# Patient Record
Sex: Male | Born: 1975
Health system: Southern US, Community
[De-identification: ages and names within clinical notes are randomized; demographics above are authoritative.]

## PROBLEM LIST (undated history)

## (undated) DIAGNOSIS — N200 Calculus of kidney: Secondary | ICD-10-CM

## (undated) DIAGNOSIS — I10 Essential (primary) hypertension: Secondary | ICD-10-CM

## (undated) HISTORY — DX: Calculus of kidney: N20.0

## (undated) HISTORY — PX: LITHOTRIPSY: SUR834

## (undated) HISTORY — PX: ESOPHAGOGASTRODUODENOSCOPY: SHX1529

## (undated) HISTORY — PX: KNEE SURGERY: SHX244

---

## 2013-05-09 ENCOUNTER — Emergency Department (HOSPITAL_COMMUNITY)
Admission: EM | Admit: 2013-05-09 | Discharge: 2013-05-09 | Disposition: A | Discharge status: Other Acute Inpatient Hospital | Payer: 59 | Attending: Emergency Medicine | Admitting: Emergency Medicine

## 2013-05-09 ENCOUNTER — Encounter (HOSPITAL_COMMUNITY): Payer: Self-pay | Admitting: Emergency Medicine

## 2013-05-09 ENCOUNTER — Emergency Department (HOSPITAL_COMMUNITY): Payer: 59

## 2013-05-09 DIAGNOSIS — Z79899 Other long term (current) drug therapy: Secondary | ICD-10-CM | POA: Diagnosis not present

## 2013-05-09 DIAGNOSIS — T23229A Burn of second degree of unspecified single finger (nail) except thumb, initial encounter: Secondary | ICD-10-CM | POA: Insufficient documentation

## 2013-05-09 DIAGNOSIS — X020XXA Exposure to flames in controlled fire in building or structure, initial encounter: Secondary | ICD-10-CM | POA: Diagnosis not present

## 2013-05-09 DIAGNOSIS — T24239A Burn of second degree of unspecified lower leg, initial encounter: Secondary | ICD-10-CM | POA: Insufficient documentation

## 2013-05-09 DIAGNOSIS — Y9389 Activity, other specified: Secondary | ICD-10-CM | POA: Diagnosis not present

## 2013-05-09 DIAGNOSIS — T25229A Burn of second degree of unspecified foot, initial encounter: Secondary | ICD-10-CM | POA: Insufficient documentation

## 2013-05-09 DIAGNOSIS — I10 Essential (primary) hypertension: Secondary | ICD-10-CM | POA: Insufficient documentation

## 2013-05-09 DIAGNOSIS — T23009A Burn of unspecified degree of unspecified hand, unspecified site, initial encounter: Secondary | ICD-10-CM | POA: Diagnosis present

## 2013-05-09 DIAGNOSIS — Y92009 Unspecified place in unspecified non-institutional (private) residence as the place of occurrence of the external cause: Secondary | ICD-10-CM | POA: Diagnosis not present

## 2013-05-09 DIAGNOSIS — T300 Burn of unspecified body region, unspecified degree: Secondary | ICD-10-CM

## 2013-05-09 DIAGNOSIS — T23202A Burn of second degree of left hand, unspecified site, initial encounter: Secondary | ICD-10-CM

## 2013-05-09 DIAGNOSIS — T23209A Burn of second degree of unspecified hand, unspecified site, initial encounter: Secondary | ICD-10-CM | POA: Insufficient documentation

## 2013-05-09 DIAGNOSIS — T23232A Burn of second degree of multiple left fingers (nail), not including thumb, initial encounter: Secondary | ICD-10-CM

## 2013-05-09 HISTORY — DX: Essential (primary) hypertension: I10

## 2013-05-09 HISTORY — DX: Morbid (severe) obesity due to excess calories: E66.01

## 2013-05-09 LAB — CARBOXYHEMOGLOBIN
Carboxyhemoglobin: 2.1 % — ABNORMAL HIGH (ref 0.5–1.5)
Methemoglobin: 0.9 % (ref 0.0–1.5)
O2 Saturation: 57.6 %
Total hemoglobin: 18.1 g/dL — ABNORMAL HIGH (ref 13.5–18.0)

## 2013-05-09 MED ORDER — HYDROMORPHONE HCL PF 1 MG/ML IJ SOLN
1.0000 mg | Freq: Once | INTRAMUSCULAR | Status: AC
Start: 2013-05-09 — End: 2013-05-09
  Administered 2013-05-09: 1 mg via INTRAVENOUS
  Filled 2013-05-09: qty 1

## 2013-05-09 MED ORDER — HYDROMORPHONE HCL PF 1 MG/ML IJ SOLN
1.0000 mg | Freq: Once | INTRAMUSCULAR | Status: AC
  Administered 2013-05-09: 1 mg via INTRAVENOUS
  Filled 2013-05-09: qty 1

## 2013-05-09 MED ORDER — LACTATED RINGERS IV BOLUS (SEPSIS)
1000.0000 mL | Freq: Once | INTRAVENOUS | Status: AC
  Administered 2013-05-09: 1000 mL via INTRAVENOUS
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
## 2013-05-09 NOTE — ED Notes (Signed)
We saline dressing applied to feet and legs.
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
## 2013-05-09 NOTE — ED Provider Notes (Signed)
Medical screening examination/treatment/procedure(s) were conducted as a shared visit with non-physician practitioner(s) and myself.  I personally evaluated the patient during the encounter.   EKG Interpretation None        Shon Batonourtney F Guilford Shannahan, MD 05/09/13 2021

## 2013-05-19 DIAGNOSIS — L0390 Cellulitis, unspecified: Secondary | ICD-10-CM

## 2013-05-19 HISTORY — DX: Cellulitis, unspecified: L03.90

## 2015-01-23 DIAGNOSIS — R7301 Impaired fasting glucose: Secondary | ICD-10-CM | POA: Insufficient documentation

## 2015-01-23 DIAGNOSIS — K2270 Barrett's esophagus without dysplasia: Secondary | ICD-10-CM

## 2015-01-23 DIAGNOSIS — E782 Mixed hyperlipidemia: Secondary | ICD-10-CM

## 2015-01-23 DIAGNOSIS — K219 Gastro-esophageal reflux disease without esophagitis: Secondary | ICD-10-CM | POA: Insufficient documentation

## 2015-01-23 DIAGNOSIS — Q6650 Congenital pes planus, unspecified foot: Secondary | ICD-10-CM

## 2015-01-23 HISTORY — DX: Barrett's esophagus without dysplasia: K22.70

## 2015-01-23 HISTORY — DX: Impaired fasting glucose: R73.01

## 2015-01-23 HISTORY — DX: Mixed hyperlipidemia: E78.2

## 2015-01-23 HISTORY — DX: Congenital pes planus, unspecified foot: Q66.50

## 2015-01-23 HISTORY — DX: Gastro-esophageal reflux disease without esophagitis: K21.9

## 2015-02-22 DIAGNOSIS — R0683 Snoring: Secondary | ICD-10-CM

## 2015-02-22 DIAGNOSIS — G4719 Other hypersomnia: Secondary | ICD-10-CM

## 2015-02-22 HISTORY — DX: Other hypersomnia: G47.19

## 2015-02-22 HISTORY — DX: Snoring: R06.83

## 2015-05-24 DIAGNOSIS — G4733 Obstructive sleep apnea (adult) (pediatric): Secondary | ICD-10-CM

## 2015-05-24 HISTORY — DX: Obstructive sleep apnea (adult) (pediatric): G47.33

## 2016-01-06 DIAGNOSIS — J329 Chronic sinusitis, unspecified: Secondary | ICD-10-CM

## 2016-01-06 HISTORY — DX: Chronic sinusitis, unspecified: J32.9

## 2016-02-21 DIAGNOSIS — R109 Unspecified abdominal pain: Secondary | ICD-10-CM

## 2016-02-21 DIAGNOSIS — R1031 Right lower quadrant pain: Secondary | ICD-10-CM | POA: Insufficient documentation

## 2016-02-21 DIAGNOSIS — M5440 Lumbago with sciatica, unspecified side: Secondary | ICD-10-CM

## 2016-02-21 HISTORY — DX: Right lower quadrant pain: R10.31

## 2016-02-21 HISTORY — DX: Lumbago with sciatica, unspecified side: M54.40

## 2016-02-21 HISTORY — DX: Unspecified abdominal pain: R10.9

## 2016-07-19 DIAGNOSIS — R300 Dysuria: Secondary | ICD-10-CM

## 2016-07-19 HISTORY — DX: Dysuria: R30.0

## 2017-04-08 ENCOUNTER — Encounter: Payer: Self-pay | Admitting: Family Medicine

## 2017-04-08 ENCOUNTER — Ambulatory Visit: Payer: 59 | Admitting: Family Medicine

## 2017-04-08 VITALS — BP 132/80 | HR 77 | Ht 72.0 in | Wt 253.1 lb

## 2017-04-08 DIAGNOSIS — I10 Essential (primary) hypertension: Secondary | ICD-10-CM

## 2017-04-08 HISTORY — DX: Essential (primary) hypertension: I10

## 2017-04-08 MED ORDER — IRBESARTAN 75 MG PO TABS: 75.0000 mg | ORAL_TABLET | Freq: Every day | ORAL | 1 refills | Status: DC

## 2017-04-08 MED ORDER — AMLODIPINE BESYLATE 10 MG PO TABS: 10.0000 mg | ORAL_TABLET | Freq: Every day | ORAL | 1 refills | Status: DC

## 2017-04-08 NOTE — Progress Notes (Signed)
Subjective:  Patient ID: Corey PalmerBrian A Kai, male    DOB: 07-29-1975  Age: 42 y.o. MRN: [id number]  CC: Establish Care   HPI Corey PalmerBrian A Eldredge presents for establishment of care in my new practice.  Patient is well-known to me.  He has been out of the olmesartan for almost a week now.  He is making a concerted effort effort to improve his lifestyle.  He is no longer consuming sugar and limiting his carbohydrates.  Avoiding all processed meats.  His goal is to lose 40 or 50 pounds.  He continues to turn wrenches.  He does not smoke drink or use illicit drugs.  He lives with his wife and son.  History Arlys JohnBrian has a past medical history of Hypertension.   He has no past surgical history on file.   His family history is not on file.He reports that  has never smoked. he has never used smokeless tobacco. He reports that he does not drink alcohol or use drugs.  Outpatient Medications Prior to Visit  Medication Sig Dispense Refill  . amLODipine (NORVASC) 10 MG tablet Take 1 tablet by mouth daily.    Marland Kitchen. olmesartan (BENICAR) 40 MG tablet Take 40 mg by mouth daily.    Marland Kitchen. losartan (COZAAR) 100 MG tablet Take 100 mg by mouth daily.     No facility-administered medications prior to visit.     ROS Review of Systems  Constitutional: Negative.   HENT: Negative.   Eyes: Negative.   Respiratory: Negative.   Cardiovascular: Negative.   Gastrointestinal: Negative.   Endocrine: Negative for polyphagia and polyuria.  Genitourinary: Negative.   Skin: Negative.   Neurological: Negative.   Hematological: Negative.   Psychiatric/Behavioral: Negative.     Objective:  BP 132/80 (BP Location: Left Arm, Patient Position: Sitting, Cuff Size: Normal)   Pulse 77   Ht 6' (1.829 m)   Wt 253 lb 2 oz (114.8 kg)   SpO2 97%   BMI 34.33 kg/m   Physical Exam  Constitutional: He is oriented to person, place, and time. He appears well-developed and well-nourished. No distress.  HENT:  Head: Normocephalic and atraumatic.   Right Ear: External ear normal.  Left Ear: External ear normal.  Mouth/Throat: Oropharynx is clear and moist. No oropharyngeal exudate.  Eyes: Conjunctivae are normal. Pupils are equal, round, and reactive to light. Right eye exhibits no discharge. Left eye exhibits no discharge. No scleral icterus.  Neck: Neck supple. No JVD present. No tracheal deviation present. No thyromegaly present.  Cardiovascular: Normal rate, regular rhythm and normal heart sounds.  Pulmonary/Chest: Effort normal and breath sounds normal. No stridor. No respiratory distress. He has no wheezes. He has no rales. He exhibits no tenderness.  Abdominal: Bowel sounds are normal.  Lymphadenopathy:    He has no cervical adenopathy.  Neurological: He is alert and oriented to person, place, and time.  Skin: Skin is warm and dry. He is not diaphoretic.  Psychiatric: He has a normal mood and affect.      Assessment & Plan:   Arlys JohnBrian was seen today for establish care.  Diagnoses and all orders for this visit:  Essential hypertension -     irbesartan (AVAPRO) 75 MG tablet; Take 1 tablet (75 mg total) by mouth daily. -     amLODipine (NORVASC) 10 MG tablet; Take 1 tablet (10 mg total) by mouth daily.   I have discontinued Arlys JohnBrian A. Edge's losartan and olmesartan. I have also changed his amLODipine. Additionally, I am  having him start on irbesartan.  Meds ordered this encounter  Medications  . irbesartan (AVAPRO) 75 MG tablet    Sig: Take 1 tablet (75 mg total) by mouth daily.    Dispense:  100 tablet    Refill:  1  . amLODipine (NORVASC) 10 MG tablet    Sig: Take 1 tablet (10 mg total) by mouth daily.    Dispense:  100 tablet    Refill:  1   I applauded his lifestyle changes.  Encouraged him to lose weight.  We will start him back on low-dose Avapro in place of the olmesartan.  As he continues to be may be able to discontinue the avid Avapro and possibly both blood pressure medicines.  We will check fasting blood  work next visit in 6 months.  Follow-up: No Follow-up on file.  Mliss Sax, MD

## 2017-04-10 ENCOUNTER — Encounter: Payer: Self-pay | Admitting: Family Medicine

## 2017-08-13 ENCOUNTER — Ambulatory Visit: Payer: 59 | Admitting: Family Medicine

## 2017-08-13 ENCOUNTER — Encounter: Payer: Self-pay | Admitting: Family Medicine

## 2017-08-13 VITALS — BP 138/80 | HR 94 | Temp 98.4°F | Ht 72.0 in | Wt 250.0 lb

## 2017-08-13 DIAGNOSIS — T887XXA Unspecified adverse effect of drug or medicament, initial encounter: Secondary | ICD-10-CM | POA: Diagnosis not present

## 2017-08-13 DIAGNOSIS — I781 Nevus, non-neoplastic: Secondary | ICD-10-CM | POA: Diagnosis not present

## 2017-08-13 DIAGNOSIS — L570 Actinic keratosis: Secondary | ICD-10-CM | POA: Diagnosis not present

## 2017-08-13 DIAGNOSIS — I10 Essential (primary) hypertension: Secondary | ICD-10-CM

## 2017-08-13 DIAGNOSIS — X32XXXA Exposure to sunlight, initial encounter: Secondary | ICD-10-CM

## 2017-08-13 HISTORY — DX: Actinic keratosis: L57.0

## 2017-08-13 HISTORY — DX: Unspecified adverse effect of drug or medicament, initial encounter: T88.7XXA

## 2017-08-13 HISTORY — DX: Nevus, non-neoplastic: I78.1

## 2017-08-13 MED ORDER — IRBESARTAN 300 MG PO TABS: 300.0000 mg | ORAL_TABLET | Freq: Every day | ORAL | 0 refills | Status: DC
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
## 2017-08-13 NOTE — Patient Instructions (Signed)
Actinic Keratosis An actinic keratosis is a precancerous growth on the skin. This means that it could develop into skin cancer if it is not treated. About 1% of these growths (actinic keratoses) turn into skin cancer within one year if they are not treated. It is important to have all of these growths evaluated to determine the best treatment approach. What are the causes? This condition is caused by getting too much ultraviolet (UV) radiation from the sun or other UV light sources. What increases the risk? The following factors may make you more likely to develop this condition:  Having light-colored skin and blue eyes.  Having blonde or red hair.  Spending a lot of time in the sun.  Inadequate skin protection when outdoors. This may include: ? Not using sunscreen properly. ? Not covering up skin that is exposed to sunlight.  Aging. The risk of developing an actinic keratosis increases with age.  What are the signs or symptoms? Actinic keratoses look like scaly, rough spots of skin.They can be as small as a pinhead or as big as a quarter. They may itch, hurt, or feel sensitive. In most cases, the growths become red. In some cases, they may be skin-colored, light tan, dark tan, pink, or a combination of any of these colors. There may be a small piece of pink or gray skin (skin tag) growing from the actinic keratosis. In some cases, it may be easier to notice actinic keratoses by feeling them, rather than seeing them. Actinic keratoses appear most often on areas of skin that get a lot of sun exposure, including the scalp, face, ears, lips, upper back, forearms, and the backs of the hands. Sometimes, actinic keratoses disappear, but many reappear a few days to a few weeks later. How is this diagnosed? This condition is usually diagnosed with a physical exam. A tissue sample may be removed from the actinic keratosis and examined under a microscope (biopsy). How is this treated?  Treatment for  this condition may include:  Scraping off the actinic keratosis (curettage).  Freezing the actinic keratosis with liquid nitrogen (cryosurgery). This causes the growth to eventually fall off the skin.  Applying medicated creams or gels to destroy the cells in the growth.  Applying chemicals to the actinic keratosis to make the outer layers of skin peel off (chemical peel).  Photodynamic therapy. In this procedure, medicated cream is applied to the actinic keratosis. This cream increases your skin's sensitivity to light. Then, a strong light is aimed at the actinic keratosis to destroy cells in the growth.  Follow these instructions at home: Skin care  Apply cool, wet cloths (cool compresses) to the affected areas.  Do not scratch your skin.  Check your skin regularly for any growths, especially growths that: ? Start to itch or bleed. ? Change in size, shape, or color. Caring for the treated area  Keep the treated area clean and dry as told by your health care provider.  Do not apply any medicine, cream, or lotion to the treated area unless your health care provider tells you to do that.  Do not pick at blisters or try to break them open. This can cause infection and scarring.  If you have red or irritated skin after treatment, follow instructions from your health care provider about how to take care of the treated area. Make sure you: ? Wash your hands with soap and water before you change your bandage (dressing). If soap and water are not available, use   hand sanitizer. ? Change your dressing as told by your health care provider.  If you have red or irritated skin after treatment, check your treated area every day for signs of infection. Check for: ? Swelling, pain, or more redness. ? Fluid or blood. ? Warmth. ? Pus or a bad smell. General instructions  Take over-the-counter and prescription medicines only as told by your health care provider.  Return to your normal  activities as told by your health care provider. Ask your health care provider what activities are safe for you.  Do not use any tobacco products, such as cigarettes, chewing tobacco, and e-cigarettes. If you need help quitting, ask your health care provider.  Have a skin exam done every year by a health care provider who is a skin conditions specialist (dermatologist).  Keep all follow-up visits as told by your health care provider. This is important. How is this prevented?  Do not get sunburns.  Try to avoid the sun between 10:00 a.m. and 4:00 p.m. This is when the UV light is the strongest.  Use a sunscreen or sunblock with SPF 30 (sun protection factor 30) or greater.  Apply sunscreen before you are exposed to sunlight, and reapply periodically as often as directed by the instructions on the sunscreen container.  Always wear sunglasses that have UV protection, and always wear hats and clothing to protect your skin from sunlight.  When possible, avoid medicines that increase your sensitivity to sunlight. These include: ? Certain antibiotic medicines. ? Certain water pills (diuretics). ? Certain prescription medicines that are used to treat acne (retinoids).  Do not use tanning beds or other indoor tanning devices. Contact a health care provider if:  You notice any changes or new growths on your skin.  You have swelling, pain, or more redness around your treated area.  You have fluid or blood coming from your treated area.  Your treated area feels warm to the touch.  You have pus or a bad smell coming from your treated area.  You have a fever.  You have a blister that becomes large and painful. This information is not intended to replace advice given to you by your health care provider. Make sure you discuss any questions you have with your health care provider. Document Released: 04/20/2008 Document Revised: 09/23/2015 Document Reviewed: 10/02/2014 Elsevier Interactive  Patient Education  2018 [employer]  

## 2017-08-15 ENCOUNTER — Telehealth: Payer: Self-pay

## 2017-08-15 NOTE — Telephone Encounter (Signed)
Copied from CRM [id number]. Topic: Referral - Question >> Aug 15, 2017 11:39 AM Maia Pettiesrtiz, Kristie S wrote: Reason for CRM: Pt wife called and states [employer] has updated PCP in the system to Dr. Doreene BurkeKremer. They gave her confirmation # [id number] that the update has been made. They advised it may take 24 hours to update and that we can process the referral for pt electronically to dermatology. Please call once [facility] referral/auth # so they can schedule appt.

## 2017-08-16 NOTE — Telephone Encounter (Signed)
Patient's wife calling and states that they have the insurance and PCP name taken care of. Was wanting to know if they need to wait to schedule an appointment with the dermatology referral or if it can be scheduled without the card? States it will take 10 business day to get to them.Please advise CB#: [phone]

## 2017-08-19 NOTE — Telephone Encounter (Signed)
Please see below.

## 2017-08-20 ENCOUNTER — Encounter: Payer: Self-pay | Admitting: Family Medicine

## 2017-08-22 NOTE — Telephone Encounter (Signed)
[facility] prior to phone call. Patient was mailed a letter with appointment date, time and location of appointment.   * [facility] as insurance requires referral with the name/office patient is being referred to  through there web site. [facility] is effective 08/19/17-02/15/18 with 99 visits. Referral ID # [id number].

## 2017-09-09 ENCOUNTER — Ambulatory Visit: Payer: 59 | Admitting: Family Medicine

## 2017-09-11 ENCOUNTER — Ambulatory Visit: Payer: 59 | Admitting: Family Medicine

## 2017-09-11 ENCOUNTER — Telehealth: Payer: Self-pay | Admitting: Family Medicine

## 2017-09-11 ENCOUNTER — Encounter: Payer: Self-pay | Admitting: Family Medicine

## 2017-09-11 VITALS — BP 132/80 | HR 82 | Temp 98.0°F | Ht 72.0 in | Wt 249.5 lb

## 2017-09-11 DIAGNOSIS — Z0001 Encounter for general adult medical examination with abnormal findings: Secondary | ICD-10-CM | POA: Diagnosis not present

## 2017-09-11 DIAGNOSIS — G43109 Migraine with aura, not intractable, without status migrainosus: Secondary | ICD-10-CM | POA: Diagnosis not present

## 2017-09-11 DIAGNOSIS — Z0000 Encounter for general adult medical examination without abnormal findings: Secondary | ICD-10-CM | POA: Insufficient documentation

## 2017-09-11 DIAGNOSIS — I10 Essential (primary) hypertension: Secondary | ICD-10-CM | POA: Diagnosis not present

## 2017-09-11 DIAGNOSIS — J0100 Acute maxillary sinusitis, unspecified: Secondary | ICD-10-CM

## 2017-09-11 HISTORY — DX: Acute maxillary sinusitis, unspecified: J01.00

## 2017-09-11 HISTORY — DX: Encounter for general adult medical examination without abnormal findings: Z00.00

## 2017-09-11 LAB — URINALYSIS, ROUTINE W REFLEX MICROSCOPIC
BILIRUBIN URINE: NEGATIVE
Hgb urine dipstick: NEGATIVE
Ketones, ur: NEGATIVE
Leukocytes, UA: NEGATIVE
Nitrite: NEGATIVE
RBC / HPF: NONE SEEN (ref 0–?)
Specific Gravity, Urine: 1.015 (ref 1.000–1.030)
Total Protein, Urine: NEGATIVE
UROBILINOGEN UA: 0.2 (ref 0.0–1.0)
Urine Glucose: NEGATIVE
WBC UA: NONE SEEN (ref 0–?)
pH: 7 (ref 5.0–8.0)

## 2017-09-11 LAB — LIPID PANEL
CHOLESTEROL: 209 mg/dL — AB (ref 0–200)
HDL: 40.4 mg/dL (ref >39.00)
LDL CALC: 133 mg/dL — AB (ref 0–99)
NonHDL: 168.69
Total CHOL/HDL Ratio: 5
Triglycerides: 180 mg/dL — ABNORMAL HIGH (ref 0.0–149.0)
VLDL: 36 mg/dL (ref 0.0–40.0)

## 2017-09-11 LAB — CBC
HCT: 49.6 % (ref 39.0–52.0)
Hemoglobin: 17 g/dL (ref 13.0–17.0)
MCHC: 34.2 g/dL (ref 30.0–36.0)
MCV: 86.8 fl (ref 78.0–100.0)
Platelets: 252 10*3/uL (ref 150.0–400.0)
RBC: 5.72 Mil/uL (ref 4.22–5.81)
RDW: 13.8 % (ref 11.5–15.5)
WBC: 7.3 10*3/uL (ref 4.0–10.5)

## 2017-09-11 LAB — COMPREHENSIVE METABOLIC PANEL
ALBUMIN: 4.6 g/dL (ref 3.5–5.2)
ALT: 23 U/L (ref 0–53)
AST: 15 U/L (ref 0–37)
Alkaline Phosphatase: 64 U/L (ref 39–117)
BUN: 18 mg/dL (ref 6–23)
CALCIUM: 9.6 mg/dL (ref 8.4–10.5)
CHLORIDE: 105 meq/L (ref 96–112)
CO2: 29 mEq/L (ref 19–32)
Creatinine, Ser: 0.99 mg/dL (ref 0.40–1.50)
GFR: 87.13 mL/min (ref >60.00)
Glucose, Bld: 101 mg/dL — ABNORMAL HIGH (ref 70–99)
POTASSIUM: 4.2 meq/L (ref 3.5–5.1)
Sodium: 140 mEq/L (ref 135–145)
Total Bilirubin: 1 mg/dL (ref 0.2–1.2)
Total Protein: 6.8 g/dL (ref 6.0–8.3)

## 2017-09-11 MED ORDER — AMOXICILLIN 500 MG PO CAPS: 500.0000 mg | ORAL_CAPSULE | Freq: Three times a day (TID) | ORAL | 0 refills | Status: DC

## 2017-09-11 MED ORDER — IRBESARTAN 300 MG PO TABS: 300.0000 mg | ORAL_TABLET | Freq: Every day | ORAL | 0 refills | Status: DC

## 2017-09-11 MED ORDER — MOMETASONE FUROATE 50 MCG/ACT NA SUSP: 2.0000 | Freq: Every day | NASAL | 6 refills | Status: DC

## 2017-09-11 MED ORDER — PREDNISONE 20 MG PO TABS: 20.0000 mg | ORAL_TABLET | Freq: Two times a day (BID) | ORAL | 0 refills | Status: AC

## 2017-09-11 NOTE — Telephone Encounter (Signed)
Received prior authorization for Nasonex from pharmacy. Prior authorization submitted via covermymeds. Key: [id number]

## 2017-09-11 NOTE — Patient Instructions (Signed)
Health Maintenance, Male A healthy lifestyle and preventive care is important for your health and wellness. Ask your health care provider about what schedule of regular examinations is right for you. What should I know about weight and diet? Eat a Healthy Diet  Eat plenty of vegetables, fruits, whole grains, low-fat dairy products, and lean protein.  Do not eat a lot of foods high in solid fats, added sugars, or salt.  Maintain a Healthy Weight Regular exercise can help you achieve or maintain a healthy weight. You should:  Do at least 150 minutes of exercise each week. The exercise should increase your heart rate and make you sweat (moderate-intensity exercise).  Do strength-training exercises at least twice a week.  Watch Your Levels of Cholesterol and Blood Lipids  Have your blood tested for lipids and cholesterol every 5 years starting at 44 years of age. If you are at high risk for heart disease, you should start having your blood tested when you are 44 years old. You may need to have your cholesterol levels checked more often if: ? Your lipid or cholesterol levels are high. ? You are older than 44 years of age. ? You are at high risk for heart disease.  What should I know about cancer screening? Many types of cancers can be detected early and may often be prevented. Lung Cancer  You should be screened every year for lung cancer if: ? You are a current smoker who has smoked for at least 30 years. ? You are a former smoker who has quit within the past 15 years.  Talk to your health care provider about your screening options, when you should start screening, and how often you should be screened.  Colorectal Cancer  Routine colorectal cancer screening usually begins at 44 years of age and should be repeated every 5-10 years until you are 44 years old. You may need to be screened more often if early forms of precancerous polyps or small growths are found. Your health care provider  may recommend screening at an earlier age if you have risk factors for colon cancer.  Your health care provider may recommend using home test kits to check for hidden blood in the stool.  A small camera at the end of a tube can be used to examine your colon (sigmoidoscopy or colonoscopy). This checks for the earliest forms of colorectal cancer.  Prostate and Testicular Cancer  Depending on your age and overall health, your health care provider may do certain tests to screen for prostate and testicular cancer.  Talk to your health care provider about any symptoms or concerns you have about testicular or prostate cancer.  Skin Cancer  Check your skin from head to toe regularly.  Tell your health care provider about any new moles or changes in moles, especially if: ? There is a change in a mole's size, shape, or color. ? You have a mole that is larger than a pencil eraser.  Always use sunscreen. Apply sunscreen liberally and repeat throughout the day.  Protect yourself by wearing long sleeves, pants, a wide-brimmed hat, and sunglasses when outside.  What should I know about heart disease, diabetes, and high blood pressure?  If you are 18-39 years of age, have your blood pressure checked every 3-5 years. If you are 40 years of age or older, have your blood pressure checked every year. You should have your blood pressure measured twice-once when you are at a hospital or clinic, and once when   you are not at a hospital or clinic. Record the average of the two measurements. To check your blood pressure when you are not at a hospital or clinic, you can use: ? An automated blood pressure machine at a pharmacy. ? A home blood pressure monitor.  Talk to your health care provider about your target blood pressure.  If you are between 45-79 years old, ask your health care provider if you should take aspirin to prevent heart disease.  Have regular diabetes screenings by checking your fasting blood  sugar level. ? If you are at a normal weight and have a low risk for diabetes, have this test once every three years after the age of 45. ? If you are overweight and have a high risk for diabetes, consider being tested at a younger age or more often.  A one-time screening for abdominal aortic aneurysm (AAA) by ultrasound is recommended for men aged 65-75 years who are current or former smokers. What should I know about preventing infection? Hepatitis B If you have a higher risk for hepatitis B, you should be screened for this virus. Talk with your health care provider to find out if you are at risk for hepatitis B infection. Hepatitis C Blood testing is recommended for:  Everyone born from 1945 through 1965.  Anyone with known risk factors for hepatitis C.  Sexually Transmitted Diseases (STDs)  You should be screened each year for STDs including gonorrhea and chlamydia if: ? You are sexually active and are younger than 44 years of age. ? You are older than 44 years of age and your health care provider tells you that you are at risk for this type of infection. ? Your sexual activity has changed since you were last screened and you are at an increased risk for chlamydia or gonorrhea. Ask your health care provider if you are at risk.  Talk with your health care provider about whether you are at high risk of being infected with HIV. Your health care provider may recommend a prescription medicine to help prevent HIV infection.  What else can I do?  Schedule regular health, dental, and eye exams.  Stay current with your vaccines (immunizations).  Do not use any tobacco products, such as cigarettes, chewing tobacco, and e-cigarettes. If you need help quitting, ask your health care provider.  Limit alcohol intake to no more than 2 drinks per day. One drink equals 12 ounces of beer, 5 ounces of wine, or 1 ounces of hard liquor.  Do not use street drugs.  Do not share needles.  Ask your  health care provider for help if you need support or information about quitting drugs.  Tell your health care provider if you often feel depressed.  Tell your health care provider if you have ever been abused or do not feel safe at home. This information is not intended to replace advice given to you by your health care provider. Make sure you discuss any questions you have with your health care provider. Document Released: 07/21/2007 Document Revised: 09/21/2015 Document Reviewed: 10/26/2014 Elsevier Interactive Patient Education  2018 [employer] Inc[employer]  Exercising to Lose Weight Exercising can help you to lose weight. In order to lose weight through exercise, you need to do vigorous-intensity exercise. You can tell that you are exercising with vigorous intensity if you are breathing very hard and fast and cannot hold a conversation while exercising. Moderate-intensity exercise helps to maintain your current weight. You can tell that you are exercising   at a moderate level if you have a higher heart rate and faster breathing, but you are still able to hold a conversation. How often should I exercise? Choose an activity that you enjoy and set realistic goals. Your health care provider can help you to make an activity plan that works for you. Exercise regularly as directed by your health care provider. This may include:  Doing resistance training twice each week, such as: ? Push-ups. ? Sit-ups. ? Lifting weights. ? Using resistance bands.  Doing a given intensity of exercise for a given amount of time. Choose from these options: ? 150 minutes of moderate-intensity exercise every week. ? 75 minutes of vigorous-intensity exercise every week. ? A mix of moderate-intensity and vigorous-intensity exercise every week.  Children, pregnant women, people who are out of shape, people who are overweight, and older adults may need to consult a health care provider for individual recommendations. If you have  any sort of medical condition, be sure to consult your health care provider before starting a new exercise program. What are some activities that can help me to lose weight?  Walking at a rate of at least 4.5 miles an hour.  Jogging or running at a rate of 5 miles per hour.  Biking at a rate of at least 10 miles per hour.  Lap swimming.  Roller-skating or in-line skating.  Cross-country skiing.  Vigorous competitive sports, such as football, basketball, and soccer.  Jumping rope.  Aerobic dancing. How can I be more active in my day-to-day activities?  Use the stairs instead of the elevator.  Take a walk during your lunch break.  If you drive, park your car farther away from work or school.  If you take public transportation, get off one stop early and walk the rest of the way.  Make all of your phone calls while standing up and walking around.  Get up, stretch, and walk around every 30 minutes throughout the day. What guidelines should I follow while exercising?  Do not exercise so much that you hurt yourself, feel dizzy, or get very short of breath.  Consult your health care provider prior to starting a new exercise program.  Wear comfortable clothes and shoes with good support.  Drink plenty of water while you exercise to prevent dehydration or heat stroke. Body water is lost during exercise and must be replaced.  Work out until you breathe faster and your heart beats faster. This information is not intended to replace advice given to you by your health care provider. Make sure you discuss any questions you have with your health care provider. Document Released: 02/24/2010 Document Revised: 06/30/2015 Document Reviewed: 06/25/2013 Elsevier Interactive Patient Education  2018 [employer]  Migraine Headache A migraine headache is an intense, throbbing pain on one side or both sides of the head. Migraines may also cause other symptoms, such as nausea, vomiting, and  sensitivity to light and noise. What are the causes? Doing or taking certain things may also trigger migraines, such as:  Alcohol.  Smoking.  Medicines, such as: ? Medicine used to treat chest pain (nitroglycerine). ? Birth control pills. ? Estrogen pills. ? Certain blood pressure medicines.  Aged cheeses, chocolate, or caffeine.  Foods or drinks that contain nitrates, glutamate, aspartame, or tyramine.  Physical activity.  Other things that may trigger a migraine include:  Menstruation.  Pregnancy.  Hunger.  Stress, lack of sleep, too much sleep, or fatigue.  Weather changes.  What increases the risk? The  following factors may make you more likely to experience migraine headaches:  Age. Risk increases with age.  Family history of migraine headaches.  Being Caucasian.  Depression and anxiety.  Obesity.  Being a woman.  Having a hole in the heart (patent foramen ovale) or other heart problems.  What are the signs or symptoms? The main symptom of this condition is pulsating or throbbing pain. Pain may:  Happen in any area of the head, such as on one side or both sides.  Interfere with daily activities.  Get worse with physical activity.  Get worse with exposure to bright lights or loud noises.  Other symptoms may include:  Nausea.  Vomiting.  Dizziness.  General sensitivity to bright lights, loud noises, or smells.  Before you get a migraine, you may get warning signs that a migraine is developing (aura). An aura may include:  Seeing flashing lights or having blind spots.  Seeing bright spots, halos, or zigzag lines.  Having tunnel vision or blurred vision.  Having numbness or a tingling feeling.  Having trouble talking.  Having muscle weakness.  How is this diagnosed? A migraine headache can be diagnosed based on:  Your symptoms.  A physical exam.  Tests, such as CT scan or MRI of the head. These imaging tests can help rule out  other causes of headaches.  Taking fluid from the spine (lumbar puncture) and analyzing it (cerebrospinal fluid analysis, or CSF analysis).  How is this treated? A migraine headache is usually treated with medicines that:  Relieve pain.  Relieve nausea.  Prevent migraines from coming back.  Treatment may also include:  Acupuncture.  Lifestyle changes like avoiding foods that trigger migraines.  Follow these instructions at home: Medicines  Take over-the-counter and prescription medicines only as told by your health care provider.  Do not drive or use heavy machinery while taking prescription pain medicine.  To prevent or treat constipation while you are taking prescription pain medicine, your health care provider may recommend that you: ? Drink enough fluid to keep your urine clear or pale yellow. ? Take over-the-counter or prescription medicines. ? Eat foods that are high in fiber, such as fresh fruits and vegetables, whole grains, and beans. ? Limit foods that are high in fat and processed sugars, such as fried and sweet foods. Lifestyle  Avoid alcohol use.  Do not use any products that contain nicotine or tobacco, such as cigarettes and e-cigarettes. If you need help quitting, ask your health care provider.  Get at least 8 hours of sleep every night.  Limit your stress. General instructions   Keep a journal to find out what may trigger your migraine headaches. For example, write down: ? What you eat and drink. ? How much sleep you get. ? Any change to your diet or medicines.  If you have a migraine: ? Avoid things that make your symptoms worse, such as bright lights. ? It may help to lie down in a dark, quiet room. ? Do not drive or use heavy machinery. ? Ask your health care provider what activities are safe for you while you are experiencing symptoms.  Keep all follow-up visits as told by your health care provider. This is important. Contact a health care  provider if:  You develop symptoms that are different or more severe than your usual migraine symptoms. Get help right away if:  Your migraine becomes severe.  You have a fever.  You have a stiff neck.  You have vision  loss.  Your muscles feel weak or like you cannot control them.  You start to lose your balance often.  You develop trouble walking.  You faint. This information is not intended to replace advice given to you by your health care provider. Make sure you discuss any questions you have with your health care provider. Document Released: 01/22/2005 Document Revised: 08/12/2015 Document Reviewed: 07/11/2015 Elsevier Interactive Patient Education  2017 [employer].

## 2017-09-11 NOTE — Telephone Encounter (Signed)
PA approved, form faxed back to pharmacy. 
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
## 2017-09-12 LAB — HIV ANTIBODY (ROUTINE TESTING W REFLEX): HIV 1&2 Ab, 4th Generation: NONREACTIVE

## 2017-09-30 ENCOUNTER — Telehealth: Payer: Self-pay | Admitting: Family Medicine

## 2017-09-30 NOTE — Telephone Encounter (Signed)
Copied from CRM [id number]. Topic: Quick Communication - See Telephone Encounter >> Sep 30, 2017 10:46 AM [name], [name] wrote: CRM for notification. See Telephone encounter for: 09/30/17. Pt called and said that on his last visit he talked to Dr. Doreene BurkeKremer about his migraines and told him if he didn't get better to contact him and he would send meds in to his pharmacy for it. Please call pt back if any questions, thanks.

## 2017-10-01 MED ORDER — RIZATRIPTAN BENZOATE 5 MG PO TBDP: 5.0000 mg | ORAL_TABLET | ORAL | 1 refills | Status: DC | PRN

## 2017-10-01 NOTE — Addendum Note (Signed)
Addended by: Andrez GrimeKREMER, Jazline Cumbee A on: 10/01/2017 12:13 PM   Modules accepted: Orders

## 2017-10-01 NOTE — Telephone Encounter (Signed)
Have sent rx for maxalt to pharmacy. Please see me if this is not helpful.

## 2017-10-01 NOTE — Telephone Encounter (Signed)
I called and spoke with patient's wife in regards to message below & she verbalized understanding.

## 2017-10-09 ENCOUNTER — Ambulatory Visit
Admission: RE | Admit: 2017-10-09 | Discharge: 2017-10-09 | Disposition: A | Discharge status: Home / Self Care | Payer: 59 | Source: Ambulatory Visit | Attending: Family Medicine | Admitting: Family Medicine

## 2017-10-09 ENCOUNTER — Ambulatory Visit: Payer: 59 | Admitting: Family Medicine

## 2017-10-09 ENCOUNTER — Encounter: Payer: Self-pay | Admitting: Neurology

## 2017-10-09 ENCOUNTER — Encounter: Payer: Self-pay | Admitting: Family Medicine

## 2017-10-09 VITALS — BP 130/80 | HR 80 | Ht 72.0 in

## 2017-10-09 DIAGNOSIS — G4485 Primary stabbing headache: Secondary | ICD-10-CM

## 2017-10-09 DIAGNOSIS — G4452 New daily persistent headache (NDPH): Secondary | ICD-10-CM | POA: Diagnosis not present

## 2017-10-09 HISTORY — DX: New daily persistent headache (ndph): G44.52

## 2017-10-09 MED ORDER — PREDNISONE 10 MG (48) PO TBPK: ORAL_TABLET | ORAL | 0 refills | Status: DC

## 2017-10-09 MED ORDER — TRAMADOL HCL 50 MG PO TABS: ORAL_TABLET | ORAL | 0 refills | Status: DC

## 2017-10-09 MED ORDER — IOPAMIDOL (ISOVUE-300) INJECTION 61%
75.0000 mL | Freq: Once | INTRAVENOUS | Status: AC | PRN
  Administered 2017-10-09: 75 mL via INTRAVENOUS
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
## 2017-10-09 NOTE — Addendum Note (Signed)
Addended by: Nadene Rubins A on: 10/09/2017 12:09 PM   Modules accepted: Orders

## 2017-10-10 ENCOUNTER — Telehealth: Payer: Self-pay

## 2017-10-10 NOTE — Telephone Encounter (Signed)
Patient's wife is aware that the Ct is normal.

## 2017-10-10 NOTE — Telephone Encounter (Signed)
Copied from CRM [id number]. Topic: Quick Communication - Other Results >> Oct 10, 2017 10:48 AM Leafy Ro wrote: Pt wife is calling and would like head ct scan results. Pt went to [facility] yesterday

## 2017-12-04 NOTE — Progress Notes (Deleted)
NEUROLOGY CONSULTATION NOTE  Corey [name] MRN: [id number] DOB: 07-Feb-1975  Referring provider: Nadene Rubins, MD Primary care provider: Nadene Rubins, MD  Reason for consult:  headache  HISTORY OF PRESENT ILLNESS: Corey [name] is a 42 year old ***-handed male who presents for headache.  History supplemented by referring provider's note.  Onset:  *** Location:  *** Quality:  *** Intensity:  ***.  *** denies new headache, thunderclap headache or severe headache that wakes *** from sleep. Aura:  *** Prodrome:  *** Postdrome:  *** Associated symptoms:  ***.  *** denies associated unilateral numbness or weakness. Duration:  *** Frequency:  *** Frequency of abortive medication: *** Triggers:  *** Exacerbating factors:  *** Relieving factors:  *** Activity:  ***  CT Head w/wo contrast from 10/09/17 was personally reviewed and was unremarkable.  Current NSAIDS:  *** Current analgesics:  Tramadol 50mg  Current triptans:  Maxalt MLT 5mg  Current ergotamine:  *** Current anti-emetic:  *** Current muscle relaxants:  *** Current anti-anxiolytic:  *** Current sleep aide:  *** Current Antihypertensive medications:  irbesartan Current Antidepressant medications:  *** Current Anticonvulsant medications:  *** Current anti-CGRP:  *** Current Vitamins/Herbal/Supplements:  *** Current Antihistamines/Decongestants:  Nasonex Other therapy:  *** Other medication:  ***  Past NSAIDS:  *** Past analgesics:  *** Past abortive triptans:  *** Past abortive ergotamine:  *** Past muscle relaxants:  *** Past anti-emetic:  *** Past antihypertensive medications:  *** Past antidepressant medications:  *** Past anticonvulsant medications:  *** Past anti-CGRP:  *** Past vitamins/Herbal/Supplements:  *** Past antihistamines/decongestants:  *** Other past therapies:  ***  Caffeine:  *** Alcohol:  *** Smoker:  *** Diet:  *** Exercise:  *** Depression:  ***; Anxiety:  *** Other pain:   *** Sleep hygiene:  *** Family history of headache:  ***  PAST MEDICAL HISTORY: Past Medical History:  Diagnosis Date  . Hypertension     PAST SURGICAL HISTORY: No past surgical history on file.  MEDICATIONS: Current Outpatient Medications on File Prior to Visit  Medication Sig Dispense Refill  . amoxicillin (AMOXIL) 500 MG capsule Take 1 capsule (500 mg total) by mouth 3 (three) times daily. 30 capsule 0  . irbesartan (AVAPRO) 300 MG tablet Take 1 tablet (300 mg total) by mouth daily. 90 tablet 0  . mometasone (NASONEX) 50 MCG/ACT nasal spray Place 2 sprays into the nose daily. 17 g 6  . predniSONE (STERAPRED UNI-PAK 48 TAB) 10 MG (48) TBPK tablet Pharm to instruct 12 day dose pack. 48 tablet 0  . rizatriptan (MAXALT-MLT) 5 MG disintegrating tablet Take 1 tablet (5 mg total) by mouth as needed for migraine. May repeat in 2 hours if needed 10 tablet 1  . traMADol (ULTRAM) 50 MG tablet Take 1-2 at night as needed for pain. 30 tablet 0   No current facility-administered medications on file prior to visit.     ALLERGIES: Allergies  Allergen Reactions  . Hydrochlorothiazide Other (See Comments)    Sun Sensitivity Sweating   . Ace Inhibitors Other (See Comments)    Other reaction(s): Cough (ALLERGY/intolerance)   . Morphine And Related Hives and Itching    FAMILY HISTORY: Family History  Problem Relation Age of Onset  . Arthritis Mother   . Hypertension Mother   . Cancer Father   . Hypertension Father   . Asthma Sister   . Asthma Sister    ***.  SOCIAL HISTORY: Social History   Socioeconomic History  . Marital status: Married  Spouse name: Not on file  . Number of children: Not on file  . Years of education: Not on file  . Highest education level: Not on file  Occupational History  . Not on file  Social Needs  . Financial resource strain: Not on file  . Food insecurity:    Worry: Not on file    Inability: Not on file  . Transportation needs:     Medical: Not on file    Non-medical: Not on file  Tobacco Use  . Smoking status: Never Smoker  . Smokeless tobacco: Never Used  Substance and Sexual Activity  . Alcohol use: No  . Drug use: No  . Sexual activity: Not on file  Lifestyle  . Physical activity:    Days per week: Not on file    Minutes per session: Not on file  . Stress: Not on file  Relationships  . Social connections:    Talks on phone: Not on file    Gets together: Not on file    Attends religious service: Not on file    Active member of club or organization: Not on file    Attends meetings of clubs or organizations: Not on file    Relationship status: Not on file  . Intimate partner violence:    Fear of current or ex partner: Not on file    Emotionally abused: Not on file    Physically abused: Not on file    Forced sexual activity: Not on file  Other Topics Concern  . Not on file  Social History Narrative  . Not on file    REVIEW OF SYSTEMS: Constitutional: No fevers, chills, or sweats, no generalized fatigue, change in appetite Eyes: No visual changes, double vision, eye pain Ear, nose and throat: No hearing loss, ear pain, nasal congestion, sore throat Cardiovascular: No chest pain, palpitations Respiratory:  No shortness of breath at rest or with exertion, wheezes GastrointestinaI: No nausea, vomiting, diarrhea, abdominal pain, fecal incontinence Genitourinary:  No dysuria, urinary retention or frequency Musculoskeletal:  No neck pain, back pain Integumentary: No rash, pruritus, skin lesions Neurological: as above Psychiatric: No depression, insomnia, anxiety Endocrine: No palpitations, fatigue, diaphoresis, mood swings, change in appetite, change in weight, increased thirst Hematologic/Lymphatic:  No purpura, petechiae. Allergic/Immunologic: no itchy/runny eyes, nasal congestion, recent allergic reactions, rashes  PHYSICAL EXAM: *** General: No acute distress.  Patient appears ***-groomed.   *** Head:  Normocephalic/atraumatic Eyes:  fundi examined but not visualized Neck: supple, no paraspinal tenderness, full range of motion Back: No paraspinal tenderness Heart: regular rate and rhythm Lungs: Clear to auscultation bilaterally. Vascular: No carotid bruits. Neurological Exam: Mental status: alert and oriented to person, place, and time, recent and remote memory intact, fund of knowledge intact, attention and concentration intact, speech fluent and not dysarthric, language intact. Cranial nerves: CN I: not tested CN II: pupils equal, round and reactive to light, visual fields intact CN III, IV, VI:  full range of motion, no nystagmus, no ptosis CN V: facial sensation intact CN VII: upper and lower face symmetric CN VIII: hearing intact CN IX, X: gag intact, uvula midline CN XI: sternocleidomastoid and trapezius muscles intact CN XII: tongue midline Bulk & Tone: normal, no fasciculations. Motor:  5/5 throughout *** Sensation:  Pinprick *** temperature *** and vibration sensation intact.  ***. Deep Tendon Reflexes:  2+ throughout, *** toes downgoing.  *** Finger to nose testing:  Without dysmetria.  *** Heel to shin:  Without dysmetria.  ***  Gait:  Normal station and stride.  Able to turn and tandem walk. Romberg ***.  IMPRESSION: ***  PLAN: ***  Thank you for allowing me to take part in the care of this patient.  Shon Millet, DO  CC: Nadene Rubins, MD

## 2017-12-05 ENCOUNTER — Ambulatory Visit: Payer: 59 | Admitting: Neurology

## 2017-12-24 ENCOUNTER — Other Ambulatory Visit: Payer: Self-pay | Admitting: Family Medicine

## 2017-12-24 DIAGNOSIS — I10 Essential (primary) hypertension: Secondary | ICD-10-CM

## 2018-02-14 ENCOUNTER — Encounter: Payer: Self-pay | Admitting: Family Medicine

## 2018-02-14 ENCOUNTER — Ambulatory Visit: Payer: 59 | Admitting: Family Medicine

## 2018-02-14 VITALS — BP 128/80 | HR 94 | Temp 98.6°F | Ht 72.0 in | Wt 264.5 lb

## 2018-02-14 DIAGNOSIS — Z20818 Contact with and (suspected) exposure to other bacterial communicable diseases: Secondary | ICD-10-CM

## 2018-02-14 DIAGNOSIS — J0100 Acute maxillary sinusitis, unspecified: Secondary | ICD-10-CM | POA: Diagnosis not present

## 2018-02-14 LAB — POCT RAPID STREP A (OFFICE): Rapid Strep A Screen: NEGATIVE

## 2018-02-14 MED ORDER — BENZONATATE 100 MG PO CAPS: 100.0000 mg | ORAL_CAPSULE | Freq: Three times a day (TID) | ORAL | 0 refills | Status: DC | PRN

## 2018-02-14 MED ORDER — AMOXICILLIN 500 MG PO CAPS: 500.0000 mg | ORAL_CAPSULE | Freq: Three times a day (TID) | ORAL | 0 refills | Status: DC

## 2018-02-14 NOTE — Patient Instructions (Signed)
Sinusitis, Adult  Sinusitis is inflammation of your sinuses. Sinuses are hollow spaces in the bones around your face. Your sinuses are located:   Around your eyes.   In the middle of your forehead.   Behind your nose.   In your cheekbones.  Mucus normally drains out of your sinuses. When your nasal tissues become inflamed or swollen, mucus can become trapped or blocked. This allows bacteria, viruses, and fungi to grow, which leads to infection. Most infections of the sinuses are caused by a virus.  Sinusitis can develop quickly. It can last for up to 4 weeks (acute) or for more than 12 weeks (chronic). Sinusitis often develops after a cold.  What are the causes?  This condition is caused by anything that creates swelling in the sinuses or stops mucus from draining. This includes:   Allergies.   Asthma.   Infection from bacteria or viruses.   Deformities or blockages in your nose or sinuses.   Abnormal growths in the nose (nasal polyps).   Pollutants, such as chemicals or irritants in the air.   Infection from fungi (rare).  What increases the risk?  You are more likely to develop this condition if you:   Have a weak body defense system (immune system).   Do a lot of swimming or diving.   Overuse nasal sprays.   Smoke.  What are the signs or symptoms?  The main symptoms of this condition are pain and a feeling of pressure around the affected sinuses. Other symptoms include:   Stuffy nose or congestion.   Thick drainage from your nose.   Swelling and warmth over the affected sinuses.   Headache.   Upper toothache.   A cough that may get worse at night.   Extra mucus that collects in the throat or the back of the nose (postnasal drip).   Decreased sense of smell and taste.   Fatigue.   A fever.   Sore throat.   Bad breath.  How is this diagnosed?  This condition is diagnosed based on:   Your symptoms.   Your medical history.   A physical exam.   Tests to find out if your condition is  acute or chronic. This may include:  ? Checking your nose for nasal polyps.  ? Viewing your sinuses using a device that has a light (endoscope).  ? Testing for allergies or bacteria.  ? Imaging tests, such as an MRI or CT scan.  In rare cases, a bone biopsy may be done to rule out more serious types of fungal sinus disease.  How is this treated?  Treatment for sinusitis depends on the cause and whether your condition is chronic or acute.   If caused by a virus, your symptoms should go away on their own within 10 days. You may be given medicines to relieve symptoms. They include:  ? Medicines that shrink swollen nasal passages (topical intranasal decongestants).  ? Medicines that treat allergies (antihistamines).  ? A spray that eases inflammation of the nostrils (topical intranasal corticosteroids).  ? Rinses that help get rid of thick mucus in your nose (nasal saline washes).   If caused by bacteria, your health care provider may recommend waiting to see if your symptoms improve. Most bacterial infections will get better without antibiotic medicine. You may be given antibiotics if you have:  ? A severe infection.  ? A weak immune system.   If caused by narrow nasal passages or nasal polyps, you may need   to have surgery.  Follow these instructions at home:  Medicines   Take, use, or apply over-the-counter and prescription medicines only as told by your health care provider. These may include nasal sprays.   If you were prescribed an antibiotic medicine, take it as told by your health care provider. Do not stop taking the antibiotic even if you start to feel better.  Hydrate and humidify     Drink enough fluid to keep your urine pale yellow. Staying hydrated will help to thin your mucus.   Use a cool mist humidifier to keep the humidity level in your home above 50%.   Inhale steam for 10-15 minutes, 3-4 times a day, or as told by your health care provider. You can do this in the bathroom while a hot shower is  running.   Limit your exposure to cool or dry air.  Rest   Rest as much as possible.   Sleep with your head raised (elevated).   Make sure you get enough sleep each night.  General instructions     Apply a warm, moist washcloth to your face 3-4 times a day or as told by your health care provider. This will help with discomfort.   Wash your hands often with soap and water to reduce your exposure to germs. If soap and water are not available, use hand sanitizer.   Do not smoke. Avoid being around people who are smoking (secondhand smoke).   Keep all follow-up visits as told by your health care provider. This is important.  Contact a health care provider if:   You have a fever.   Your symptoms get worse.   Your symptoms do not improve within 10 days.  Get help right away if:   You have a severe headache.   You have persistent vomiting.   You have severe pain or swelling around your face or eyes.   You have vision problems.   You develop confusion.   Your neck is stiff.   You have trouble breathing.  Summary   Sinusitis is soreness and inflammation of your sinuses. Sinuses are hollow spaces in the bones around your face.   This condition is caused by nasal tissues that become inflamed or swollen. The swelling traps or blocks the flow of mucus. This allows bacteria, viruses, and fungi to grow, which leads to infection.   If you were prescribed an antibiotic medicine, take it as told by your health care provider. Do not stop taking the antibiotic even if you start to feel better.   Keep all follow-up visits as told by your health care provider. This is important.  This information is not intended to replace advice given to you by your health care provider. Make sure you discuss any questions you have with your health care provider.  Document Released: 01/22/2005 Document Revised: 06/24/2017 Document Reviewed: 06/24/2017  [employer] Interactive Patient Education  2019 [employer]

## 2018-02-14 NOTE — Progress Notes (Signed)
Established Patient Office Visit  Subjective:  Patient ID: Corey [name], male    DOB: Aug 17, 1975  Age: 43 y.o. MRN: [id number]  CC:  Chief Complaint  Patient presents with  . Sore Throat    fever, since Christmas, son had strep throat    HPI Corey PalmerBrian A [name] presents for treatment and evaluation of lingering URI symptoms over the last 2 to 3 weeks.  He is now feeling pressure in his cheekbones with some rhinorrhea and postnasal drip.  Also complains of upper teeth pain.  He was running a fever yesterday.  He has had some sore throat.  His 45-year-old son was recently diagnosed with strep.  Pacing has reported postnasal drip with cough but denies reactive airway disease or wheezing.  Denies myalgias or arthralgias.  He is tried over-the-counter cough and cold suppressants as well as is Advil.  Past Medical History:  Diagnosis Date  . Hypertension     History reviewed. No pertinent surgical history.  Family History  Problem Relation Age of Onset  . Arthritis Mother   . Hypertension Mother   . Cancer Father   . Hypertension Father   . Asthma Sister   . Asthma Sister     Social History   Socioeconomic History  . Marital status: Married    Spouse name: Not on file  . Number of children: Not on file  . Years of education: Not on file  . Highest education level: Not on file  Occupational History  . Not on file  Social Needs  . Financial resource strain: Not on file  . Food insecurity:    Worry: Not on file    Inability: Not on file  . Transportation needs:    Medical: Not on file    Non-medical: Not on file  Tobacco Use  . Smoking status: Never Smoker  . Smokeless tobacco: Never Used  Substance and Sexual Activity  . Alcohol use: No  . Drug use: No  . Sexual activity: Not on file  Lifestyle  . Physical activity:    Days per week: Not on file    Minutes per session: Not on file  . Stress: Not on file  Relationships  . Social connections:    Talks on phone: Not on  file    Gets together: Not on file    Attends religious service: Not on file    Active member of club or organization: Not on file    Attends meetings of clubs or organizations: Not on file    Relationship status: Not on file  . Intimate partner violence:    Fear of current or ex partner: Not on file    Emotionally abused: Not on file    Physically abused: Not on file    Forced sexual activity: Not on file  Other Topics Concern  . Not on file  Social History Narrative  . Not on file    Outpatient Medications Prior to Visit  Medication Sig Dispense Refill  . irbesartan (AVAPRO) 300 MG tablet TAKE 1 TABLET BY MOUTH EVERY DAY 90 tablet 2  . mometasone (NASONEX) 50 MCG/ACT nasal spray Place 2 sprays into the nose daily. 17 g 6  . rizatriptan (MAXALT-MLT) 5 MG disintegrating tablet Take 1 tablet (5 mg total) by mouth as needed for migraine. May repeat in 2 hours if needed 10 tablet 1  . amoxicillin (AMOXIL) 500 MG capsule Take 1 capsule (500 mg total) by mouth 3 (three) times daily. 30  capsule 0  . predniSONE (STERAPRED UNI-PAK 48 TAB) 10 MG (48) TBPK tablet Pharm to instruct 12 day dose pack. 48 tablet 0  . traMADol (ULTRAM) 50 MG tablet Take 1-2 at night as needed for pain. 30 tablet 0   No facility-administered medications prior to visit.     Allergies  Allergen Reactions  . Hydrochlorothiazide Other (See Comments)    Sun Sensitivity Sweating   . Ace Inhibitors Other (See Comments)    Other reaction(s): Cough (ALLERGY/intolerance)   . Morphine And Related Hives and Itching    ROS Review of Systems  Constitutional: Positive for fatigue and fever. Negative for chills, diaphoresis and unexpected weight change.  HENT: Positive for congestion, postnasal drip, rhinorrhea, sinus pressure, sinus pain and sore throat. Negative for facial swelling, hearing loss, mouth sores, trouble swallowing and voice change.   Eyes: Negative for photophobia and visual disturbance.  Respiratory:  Positive for cough. Negative for shortness of breath and wheezing.   Cardiovascular: Negative.   Gastrointestinal: Negative.   Genitourinary: Negative.   Musculoskeletal: Negative for arthralgias and myalgias.  Skin: Negative for pallor and rash.  Allergic/Immunologic: Negative for immunocompromised state.  Neurological: Positive for headaches. Negative for seizures, light-headedness and numbness.  Hematological: Does not bruise/bleed easily.  Psychiatric/Behavioral: Negative.       Objective:    Physical Exam  Constitutional: He is oriented to person, place, and time. He appears well-developed and well-nourished. No distress.  HENT:  Head: Normocephalic and atraumatic.  Right Ear: External ear normal.  Left Ear: External ear normal.  Mouth/Throat: Oropharynx is clear and moist. No oropharyngeal exudate.  Eyes: Pupils are equal, round, and reactive to light. Conjunctivae are normal. Right eye exhibits no discharge. Left eye exhibits no discharge. No scleral icterus.  Neck: Neck supple. No JVD present. No tracheal deviation present. No thyromegaly present.  Cardiovascular: Normal rate, regular rhythm and normal heart sounds.  Pulmonary/Chest: Effort normal and breath sounds normal. No stridor. No respiratory distress. He has no wheezes. He has no rales.  Lymphadenopathy:    He has no cervical adenopathy.  Neurological: He is alert and oriented to person, place, and time.  Skin: Skin is warm and dry. He is not diaphoretic.  Psychiatric: He has a normal mood and affect. His behavior is normal.    BP 128/80   Pulse 94   Temp 98.6 F (37 C) (Oral)   Ht 6' (1.829 m)   Wt 264 lb 8 oz (120 kg)   SpO2 95%   BMI 35.87 kg/m  Wt Readings from Last 3 Encounters:  02/14/18 264 lb 8 oz (120 kg)  09/11/17 249 lb 8 oz (113.2 kg)  08/13/17 250 lb (113.4 kg)   BP Readings from Last 3 Encounters:  02/14/18 128/80  10/09/17 130/80  09/11/17 132/80   Guideline developer:  UpToDate (see  UpToDate for funding source) Date Released: June 2014  Health Maintenance Due  Topic Date Due  . Janet Berlin  04/25/1992  . INFLUENZA VACCINE  09/05/2017    There are no preventive care reminders to display for this patient.  No results found for: TSH Lab Results  Component Value Date   WBC 7.3 09/11/2017   HGB 17.0 09/11/2017   HCT 49.6 09/11/2017   MCV 86.8 09/11/2017   PLT 252.0 09/11/2017   Lab Results  Component Value Date   NA 140 09/11/2017   K 4.2 09/11/2017   CO2 29 09/11/2017   GLUCOSE 101 (H) 09/11/2017   BUN 18  09/11/2017   CREATININE 0.99 09/11/2017   BILITOT 1.0 09/11/2017   ALKPHOS 64 09/11/2017   AST 15 09/11/2017   ALT 23 09/11/2017   PROT 6.8 09/11/2017   ALBUMIN 4.6 09/11/2017   CALCIUM 9.6 09/11/2017   GFR 87.13 09/11/2017   Lab Results  Component Value Date   CHOL 209 (H) 09/11/2017   Lab Results  Component Value Date   HDL 40.40 09/11/2017   Lab Results  Component Value Date   LDLCALC 133 (H) 09/11/2017   Lab Results  Component Value Date   TRIG 180.0 (H) 09/11/2017   Lab Results  Component Value Date   CHOLHDL 5 09/11/2017   No results found for: HGBA1C    Assessment & Plan:   Problem List Items Addressed This Visit      Respiratory   Acute non-recurrent maxillary sinusitis   Relevant Medications   amoxicillin (AMOXIL) 500 MG capsule   benzonatate (TESSALON) 100 MG capsule    Other Visit Diagnoses    Strep throat exposure    -  Primary   Relevant Orders   POC Rapid Strep A (Completed)      Meds ordered this encounter  Medications  . amoxicillin (AMOXIL) 500 MG capsule    Sig: Take 1 capsule (500 mg total) by mouth 3 (three) times daily.    Dispense:  30 capsule    Refill:  0  . benzonatate (TESSALON) 100 MG capsule    Sig: Take 1 capsule (100 mg total) by mouth 3 (three) times daily as needed for cough.    Dispense:  20 capsule    Refill:  0    Follow-up: No follow-ups on file.

## 2018-03-14 ENCOUNTER — Ambulatory Visit: Payer: 59 | Admitting: Family Medicine

## 2018-09-25 ENCOUNTER — Other Ambulatory Visit: Payer: Self-pay | Admitting: Family Medicine

## 2018-09-25 DIAGNOSIS — I10 Essential (primary) hypertension: Secondary | ICD-10-CM

## 2018-11-20 ENCOUNTER — Ambulatory Visit: Payer: 59 | Admitting: Family Medicine

## 2018-11-20 ENCOUNTER — Encounter: Payer: Self-pay | Admitting: Family Medicine

## 2018-11-20 ENCOUNTER — Other Ambulatory Visit: Payer: Self-pay

## 2018-11-20 VITALS — BP 138/80 | HR 88 | Temp 97.8°F | Ht 72.0 in | Wt 260.0 lb

## 2018-11-20 DIAGNOSIS — S343XXA Injury of cauda equina, initial encounter: Secondary | ICD-10-CM | POA: Insufficient documentation

## 2018-11-20 HISTORY — DX: Injury of cauda equina, initial encounter: S34.3XXA

## 2018-11-20 LAB — URINALYSIS, ROUTINE W REFLEX MICROSCOPIC
Bilirubin Urine: NEGATIVE
Hgb urine dipstick: NEGATIVE
Ketones, ur: NEGATIVE
Leukocytes,Ua: NEGATIVE
Nitrite: NEGATIVE
RBC / HPF: NONE SEEN (ref 0–?)
Specific Gravity, Urine: 1.025 (ref 1.000–1.030)
Total Protein, Urine: NEGATIVE
Urine Glucose: NEGATIVE
Urobilinogen, UA: 0.2 (ref 0.0–1.0)
pH: 5.5 (ref 5.0–8.0)

## 2018-11-20 MED ORDER — CYCLOBENZAPRINE HCL 10 MG PO TABS: 10.0000 mg | ORAL_TABLET | Freq: Three times a day (TID) | ORAL | 0 refills | Status: DC | PRN

## 2018-11-20 MED ORDER — KETOROLAC TROMETHAMINE 10 MG PO TABS: 10.0000 mg | ORAL_TABLET | Freq: Four times a day (QID) | ORAL | 0 refills | Status: DC | PRN

## 2018-11-20 MED ORDER — KETOROLAC TROMETHAMINE 60 MG/2ML IM SOLN
60.0000 mg | Freq: Once | INTRAMUSCULAR | Status: AC
  Administered 2018-11-20: 60 mg via INTRAMUSCULAR

## 2018-11-20 NOTE — Patient Instructions (Signed)
Herniated Disk  A herniated disk, also called a ruptured disk or slipped disk, occurs when a disk in the spine bulges out too far. Between the bones in the spine (vertebrae), there are oval disks that are made of a soft, spongy center that is surrounded by a tough outer ring. The disks connect your vertebrae, help your spine move, and absorb shocks from your movement. When you have a herniated disk, the spongy center of the disk bulges out or breaks through the outer ring. It can press on a nerve between the vertebrae and cause pain. This can occur anywhere in the back or neck area, but the lower back is most commonly affected. What are the causes? This condition may be caused by:  Age-related wear and tear. The spongy centers of spinal disks tend to shrink and dry out with age, which makes them more likely to herniate.  Sudden injury, such as a strain or sprain. What increases the risk? Aging is the main risk factor for a herniated disk. Other risk factors include:  Being a man who is 30-50 years old.  Frequently doing activities that involve heavy lifting, bending, or twisting.  Frequently driving for long hours at a time.  Not getting enough exercise.  Being overweight.  Smoking.  Having a family history of back problems or herniated disks.  Being pregnant or giving birth.  Having poor nutrition.  Being tall. What are the signs or symptoms? Symptoms may vary depending on where your herniated disk is located.  A herniated disk in the lower back may cause sharp pain in: ? Part of the arm, leg, hip, or buttocks. ? The back of the lower leg (calf). ? The lower back, spreading down through the leg into the foot (sciatica).  A herniated disk in the neck may cause dizziness and vertigo. It may also cause pain or weakness in: ? The neck. ? The shoulder blades. ? Upper arm, forearm, or fingers.  You may also have muscle weakness. It may be difficult to: ? Lift your leg or arm.  ? Stand on your toes. ? Squeeze tightly with one of your hands.  Other symptoms may include: ? Numbness or tingling in the affected areas of the hands, arms, feet, or legs. ? Inability to control when you urinate or when you have bowel movements. This is a rare but serious sign of a severe herniated disk in the lower back. How is this diagnosed? This condition may be diagnosed based on:  Your symptoms.  Your medical history.  A physical exam. The exam may include: ? Straight-leg test. You will lie on your back while your health care provider lifts your leg, keeping your knee straight. If you feel pain, you likely have a herniated disk. ? Neurological tests. This includes checking for numbness, reflexes, muscle strength, and posture.  Imaging tests, such as: ? X-rays. ? MRI. ? CT scan. ? Electromyogram (EMG) to check the nerves that control muscles. This test may be used to determine which nerves are affected by your herniated disk. How is this treated? Treatment for this condition may include:  A short period of rest. This is usually the first treatment. ? You may be on bed rest for up to 2 days, or you may be instructed to stay home and avoid physical activity. ? If you have a herniated disk in your lower back, avoid sitting as much as possible. Sitting increases pressure on the disk.  Medicines. These may include: ? NSAIDs   to help reduce pain and swelling. ? Muscle relaxants to prevent sudden tightening of the back muscles (back spasms). ? Prescription pain medicines, if you have severe pain.  Steroid injections in the area of the herniated disk. This can help reduce pain and swelling.  Physical therapy to strengthen your back muscles. In many cases, symptoms go away with treatment over a period of days or weeks. You will most likely be free of symptoms after 3-4 months. If other treatments do not help to relieve your symptoms, you may need surgery. Follow these instructions  at home: Medicines  Take over-the-counter and prescription medicines only as told by your health care provider.  Do not drive or use heavy machinery while taking prescription pain medicine. Activity  Rest as directed.  After your rest period: ? Return to your normal activities and gradually begin exercising as told by your health care provider. Ask your health care provider what activities and exercises are safe for you. ? Use good posture. ? Avoid movements that cause pain. ? Do not lift anything that is heavier than 10 lb (4.5 kg) until your health care provider says this is safe. ? Do not sit or stand for long periods of time without changing positions. ? Do not sit for long periods of time without getting up and moving around.  If physical therapy was prescribed, do exercises as instructed.  Aim to strengthen muscles in your back and abdomen with exercises like crunches, swimming, or walking. General instructions  Do not use any products that contain nicotine or tobacco, such as cigarettes and e-cigarettes. These products can delay healing. If you need help quitting, ask your health care provider.  Do not wear high-heeled shoes.  Do not sleep on your belly.  If you are overweight, work with your health care provider to lose weight safely.  To prevent or treat constipation while you are taking prescription pain medicine, your health care provider may recommend that you: ? Drink enough fluid to keep your urine clear or pale yellow. ? Take over-the-counter or prescription medicines. ? Eat foods that are high in fiber, such as fresh fruits and vegetables, whole grains, and beans. ? Limit foods that are high in fat and processed sugars, such as fried and sweet foods.  Keep all follow-up visits as told by your health care provider. This is important. How is this prevented?   Maintain a healthy weight.  Try to avoid stressful situations.  Maintain physical fitness. Do at  least 150 minutes of moderate-intensity exercise each week, such as brisk walking or water aerobics.  When lifting objects: ? Keep your feet at least shoulder-width apart and tighten your abdominal muscles. ? Keep your spine neutral as you bend your knees and hips. It is important to lift using the strength of your legs, not your back. Do not lock your knees straight out. ? Always ask for help to lift heavy or awkward objects. Contact a health care provider if:  You have back pain or neck pain that does not get better after 6 weeks.  You have severe pain in your back, neck, legs, or arms.  You develop numbness, tingling, or weakness in any part of your body. Get help right away if:  You cannot move your arms or legs.  You cannot control when you urinate or have bowel movements.  You feel dizzy or you faint.  You have shortness of breath. This information is not intended to replace advice given to you by   your health care provider. Make sure you discuss any questions you have with your health care provider. Document Released: 01/20/2000 Document Revised: 01/04/2017 Document Reviewed: 09/19/2015 Elsevier Patient Education  2020 [employer]  

## 2018-11-20 NOTE — Progress Notes (Signed)
Established Patient Office Visit  Subjective:  Patient ID: Corey [name], male    DOB: 02/17/1975  Age: 43 y.o. MRN: [id number]  CC:  Chief Complaint  Patient presents with  . Back Pain    HPI Corey [name] presents for treatment and evaluation of a 3-week history of increasing lower back pain.  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  The pain has moved into both legs at times.  He has had some paresthesias in the groin area.  He has had no change in his bowel or bladder function.  He did experience a brief episode of not being able to move either leg.  History of renal lithiasis but this is different.  Past Medical History:  Diagnosis Date  . Hypertension     History reviewed. No pertinent surgical history.  Family History  Problem Relation Age of Onset  . Arthritis Mother   . Hypertension Mother   . Cancer Father   . Hypertension Father   . Asthma Sister   . Asthma Sister     Social History   Socioeconomic History  . Marital status: Married    Spouse name: Not on file  . Number of children: Not on file  . Years of education: Not on file  . Highest education level: Not on file  Occupational History  . Not on file  Social Needs  . Financial resource strain: Not on file  . Food insecurity    Worry: Not on file    Inability: Not on file  . Transportation needs    Medical: Not on file    Non-medical: Not on file  Tobacco Use  . Smoking status: Never Smoker  . Smokeless tobacco: Never Used  Substance and Sexual Activity  . Alcohol use: No  . Drug use: No  . Sexual activity: Not on file  Lifestyle  . Physical activity    Days per week: Not on file    Minutes per session: Not on file  . Stress: Not on file  Relationships  . Social Herbalist on phone: Not on file    Gets together: Not on file    Attends religious service: Not on file    Active member of club or organization: Not on file    Attends meetings  of clubs or organizations: Not on file    Relationship status: Not on file  . Intimate partner violence    Fear of current or ex partner: Not on file    Emotionally abused: Not on file    Physically abused: Not on file    Forced sexual activity: Not on file  Other Topics Concern  . Not on file  Social History Narrative  . Not on file    Outpatient Medications Prior to Visit  Medication Sig Dispense Refill  . irbesartan (AVAPRO) 300 MG tablet TAKE 1 TABLET BY MOUTH EVERY DAY 90 tablet 1  . mometasone (NASONEX) 50 MCG/ACT nasal spray Place 2 sprays into the nose daily. 17 g 6  . rizatriptan (MAXALT-MLT) 5 MG disintegrating tablet Take 1 tablet (5 mg total) by mouth as needed for migraine. May repeat in 2 hours if needed 10 tablet 1  . amoxicillin (AMOXIL) 500 MG capsule Take 1 capsule (500 mg total) by mouth 3 (three) times daily. 30 capsule 0  . benzonatate (TESSALON) 100 MG capsule Take 1 capsule (100 mg total) by mouth  3 (three) times daily as needed for cough. 20 capsule 0   No facility-administered medications prior to visit.     Allergies  Allergen Reactions  . Hydrochlorothiazide Other (See Comments)    Sun Sensitivity Sweating   . Ace Inhibitors Other (See Comments)    Other reaction(s): Cough (ALLERGY/intolerance)   . Morphine And Related Hives and Itching    ROS Review of Systems  Constitutional: Negative.   Eyes: Negative for photophobia and visual disturbance.  Respiratory: Negative.   Cardiovascular: Negative.   Gastrointestinal: Negative.  Negative for constipation and diarrhea.  Endocrine: Negative for polyphagia and polyuria.  Genitourinary: Negative for difficulty urinating, frequency and urgency.  Musculoskeletal: Positive for back pain and myalgias.  Allergic/Immunologic: Negative for immunocompromised state.  Neurological: Positive for weakness and numbness.  Hematological: Does not bruise/bleed easily.  Psychiatric/Behavioral: Negative.        Objective:    Physical Exam  Constitutional: He is oriented to person, place, and time. He appears well-developed and well-nourished. No distress.  HENT:  Head: Normocephalic and atraumatic.  Right Ear: External ear normal.  Left Ear: External ear normal.  Eyes: Conjunctivae are normal. Right eye exhibits no discharge. Left eye exhibits no discharge. No scleral icterus.  Neck: No JVD present. No tracheal deviation present.  Pulmonary/Chest: Effort normal. No stridor.  Abdominal: Bowel sounds are normal.  Musculoskeletal:        General: No edema.     Lumbar back: He exhibits decreased range of motion, tenderness and bony tenderness.  Neurological: He is alert and oriented to person, place, and time. He has normal strength.  Reflex Scores:      Patellar reflexes are 2+ on the right side and 2+ on the left side.      Achilles reflexes are 1+ on the right side and 1+ on the left side. Positive dural tension signs on the left > right.   Skin: Skin is warm and dry. He is not diaphoretic.  Psychiatric: He has a normal mood and affect. His behavior is normal.    BP 138/80   Pulse 88   Temp 97.8 F (36.6 C) (Temporal)   Ht 6' (1.829 m)   Wt 260 lb (117.9 kg)   SpO2 99%   BMI 35.26 kg/m  Wt Readings from Last 3 Encounters:  11/20/18 260 lb (117.9 kg)  02/14/18 264 lb 8 oz (120 kg)  09/11/17 249 lb 8 oz (113.2 kg)   BP Readings from Last 3 Encounters:  11/20/18 138/80  02/14/18 128/80  10/09/17 130/80   Guideline developer:  UpToDate (see UpToDate for funding source) Date Released: June 2014  Health Maintenance Due  Topic Date Due  . INFLUENZA VACCINE  09/06/2018    There are no preventive care reminders to display for this patient.  No results found for: TSH Lab Results  Component Value Date   WBC 7.3 09/11/2017   HGB 17.0 09/11/2017   HCT 49.6 09/11/2017   MCV 86.8 09/11/2017   PLT 252.0 09/11/2017   Lab Results  Component Value Date   NA 140 09/11/2017   K  4.2 09/11/2017   CO2 29 09/11/2017   GLUCOSE 101 (H) 09/11/2017   BUN 18 09/11/2017   CREATININE 0.99 09/11/2017   BILITOT 1.0 09/11/2017   ALKPHOS 64 09/11/2017   AST 15 09/11/2017   ALT 23 09/11/2017   PROT 6.8 09/11/2017   ALBUMIN 4.6 09/11/2017   CALCIUM 9.6 09/11/2017   GFR 87.13 09/11/2017   Lab  Results  Component Value Date   CHOL 209 (H) 09/11/2017   Lab Results  Component Value Date   HDL 40.40 09/11/2017   Lab Results  Component Value Date   LDLCALC 133 (H) 09/11/2017   Lab Results  Component Value Date   TRIG 180.0 (H) 09/11/2017   Lab Results  Component Value Date   CHOLHDL 5 09/11/2017   No results found for: HGBA1C    Assessment & Plan:   Problem List Items Addressed This Visit      Nervous and Auditory   Cauda equina injury without bone injury (HCC) - Primary   Relevant Medications   ketorolac (TORADOL) injection 60 mg (Start on 11/20/2018  2:45 PM)   ketorolac (TORADOL) 10 MG tablet   cyclobenzaprine (FLEXERIL) 10 MG tablet   Other Relevant Orders   MR Lumbar Spine Wo Contrast   Urinalysis, Routine w reflex microscopic      Meds ordered this encounter  Medications  . ketorolac (TORADOL) injection 60 mg  . ketorolac (TORADOL) 10 MG tablet    Sig: Take 1 tablet (10 mg total) by mouth every 6 (six) hours as needed.    Dispense:  20 tablet    Refill:  0  . cyclobenzaprine (FLEXERIL) 10 MG tablet    Sig: Take 1 tablet (10 mg total) by mouth 3 (three) times daily as needed for muscle spasms.    Dispense:  30 tablet    Refill:  0    Follow-up: Return go to er immediately with increased signs and symptoms..   I have asked for a stat MRI of his lower back.  We discussed cauda equine syndrome and its seriousness.  He will go to the emergency room with any worsening of his signs and symptoms to include worsening pain, worsening weakness in either leg or increased numbness or tingling in his groin area or change of his bowel or bladder function.

## 2018-11-21 ENCOUNTER — Other Ambulatory Visit (HOSPITAL_BASED_OUTPATIENT_CLINIC_OR_DEPARTMENT_OTHER): Payer: Self-pay | Admitting: Family Medicine

## 2018-11-21 DIAGNOSIS — Z1389 Encounter for screening for other disorder: Secondary | ICD-10-CM

## 2018-11-22 ENCOUNTER — Ambulatory Visit (HOSPITAL_BASED_OUTPATIENT_CLINIC_OR_DEPARTMENT_OTHER): Payer: 59

## 2018-11-22 ENCOUNTER — Other Ambulatory Visit (HOSPITAL_BASED_OUTPATIENT_CLINIC_OR_DEPARTMENT_OTHER): Payer: 59

## 2019-01-12 ENCOUNTER — Other Ambulatory Visit: Payer: Self-pay | Admitting: Neurosurgery

## 2019-01-12 DIAGNOSIS — M5416 Radiculopathy, lumbar region: Secondary | ICD-10-CM

## 2019-04-16 ENCOUNTER — Other Ambulatory Visit: Payer: Self-pay | Admitting: Family Medicine

## 2019-04-16 DIAGNOSIS — I10 Essential (primary) hypertension: Secondary | ICD-10-CM

## 2019-04-24 ENCOUNTER — Encounter: Payer: Self-pay | Admitting: Family Medicine

## 2019-04-24 ENCOUNTER — Other Ambulatory Visit: Payer: Self-pay

## 2019-04-24 ENCOUNTER — Ambulatory Visit (INDEPENDENT_AMBULATORY_CARE_PROVIDER_SITE_OTHER): Payer: 59 | Admitting: Family Medicine

## 2019-04-24 VITALS — BP 142/90 | HR 94 | Temp 97.6°F | Ht 72.0 in | Wt 258.8 lb

## 2019-04-24 DIAGNOSIS — R195 Other fecal abnormalities: Secondary | ICD-10-CM

## 2019-04-24 DIAGNOSIS — N410 Acute prostatitis: Secondary | ICD-10-CM | POA: Diagnosis not present

## 2019-04-24 DIAGNOSIS — N529 Male erectile dysfunction, unspecified: Secondary | ICD-10-CM

## 2019-04-24 DIAGNOSIS — I10 Essential (primary) hypertension: Secondary | ICD-10-CM

## 2019-04-24 DIAGNOSIS — N401 Enlarged prostate with lower urinary tract symptoms: Secondary | ICD-10-CM

## 2019-04-24 DIAGNOSIS — R3912 Poor urinary stream: Secondary | ICD-10-CM

## 2019-04-24 HISTORY — DX: Other fecal abnormalities: R19.5

## 2019-04-24 HISTORY — DX: Acute prostatitis: N41.0

## 2019-04-24 HISTORY — DX: Benign prostatic hyperplasia with lower urinary tract symptoms: N40.1

## 2019-04-24 HISTORY — DX: Male erectile dysfunction, unspecified: N52.9

## 2019-04-24 LAB — URINALYSIS, ROUTINE W REFLEX MICROSCOPIC
Bilirubin Urine: NEGATIVE
Hgb urine dipstick: NEGATIVE
Ketones, ur: NEGATIVE
Leukocytes,Ua: NEGATIVE
Nitrite: NEGATIVE
Specific Gravity, Urine: 1.025 (ref 1.000–1.030)
Total Protein, Urine: NEGATIVE
Urine Glucose: NEGATIVE
Urobilinogen, UA: 0.2 (ref 0.0–1.0)
WBC, UA: NONE SEEN — AB (ref 0–?)
pH: 6 (ref 5.0–8.0)

## 2019-04-24 LAB — COMPREHENSIVE METABOLIC PANEL
ALT: 27 U/L (ref 0–53)
AST: 20 U/L (ref 0–37)
Albumin: 4.5 g/dL (ref 3.5–5.2)
Alkaline Phosphatase: 74 U/L (ref 39–117)
BUN: 16 mg/dL (ref 6–23)
CO2: 27 mEq/L (ref 19–32)
Calcium: 9.6 mg/dL (ref 8.4–10.5)
Chloride: 102 mEq/L (ref 96–112)
Creatinine, Ser: 1.04 mg/dL (ref 0.40–1.50)
GFR: 76.89 mL/min (ref >60.00)
Glucose, Bld: 98 mg/dL (ref 70–99)
Potassium: 4.3 mEq/L (ref 3.5–5.1)
Sodium: 137 mEq/L (ref 135–145)
Total Bilirubin: 0.9 mg/dL (ref 0.2–1.2)
Total Protein: 6.9 g/dL (ref 6.0–8.3)

## 2019-04-24 LAB — CBC
HCT: 49.6 % (ref 39.0–52.0)
Hemoglobin: 16.9 g/dL (ref 13.0–17.0)
MCHC: 34.1 g/dL (ref 30.0–36.0)
MCV: 86.1 fl (ref 78.0–100.0)
Platelets: 254 10*3/uL (ref 150.0–400.0)
RBC: 5.77 Mil/uL (ref 4.22–5.81)
RDW: 13.6 % (ref 11.5–15.5)
WBC: 11.5 10*3/uL — ABNORMAL HIGH (ref 4.0–10.5)

## 2019-04-24 MED ORDER — SILDENAFIL CITRATE 20 MG PO TABS: ORAL_TABLET | ORAL | 2 refills | Status: DC

## 2019-04-24 MED ORDER — SULFAMETHOXAZOLE-TRIMETHOPRIM 800-160 MG PO TABS: 1.0000 | ORAL_TABLET | Freq: Two times a day (BID) | ORAL | 0 refills | Status: AC

## 2019-04-24 NOTE — Patient Instructions (Signed)
Benign Prostatic Hyperplasia  Benign prostatic hyperplasia (BPH) is an enlarged prostate gland that is caused by the normal aging process and not by cancer. The prostate is a walnut-sized gland that is involved in the production of semen. It is located in front of the rectum and below the bladder. The bladder stores urine and the urethra is the tube that carries the urine out of the body. The prostate may get bigger as a man gets older. An enlarged prostate can press on the urethra. This can make it harder to pass urine. The build-up of urine in the bladder can cause infection. Back pressure and infection may progress to bladder damage and kidney (renal) failure. What are the causes? This condition is part of a normal aging process. However, not all men develop problems from this condition. If the prostate enlarges away from the urethra, urine flow will not be blocked. If it enlarges toward the urethra and compresses it, there will be problems passing urine. What increases the risk? This condition is more likely to develop in men over the age of 50 years. What are the signs or symptoms? Symptoms of this condition include:  Getting up often during the night to urinate.  Needing to urinate frequently during the day.  Difficulty starting urine flow.  Decrease in size and strength of your urine stream.  Leaking (dribbling) after urinating.  Inability to pass urine. This needs immediate treatment.  Inability to completely empty your bladder.  Pain when you pass urine. This is more common if there is also an infection.  Urinary tract infection (UTI). How is this diagnosed? This condition is diagnosed based on your medical history, a physical exam, and your symptoms. Tests will also be done, such as:  A post-void bladder scan. This measures any amount of urine that may remain in your bladder after you finish urinating.  A digital rectal exam. In a rectal exam, your health care provider  checks your prostate by putting a lubricated, gloved finger into your rectum to feel the back of your prostate gland. This exam detects the size of your gland and any abnormal lumps or growths.  An exam of your urine (urinalysis).  A prostate specific antigen (PSA) screening. This is a blood test used to screen for prostate cancer.  An ultrasound. This test uses sound waves to electronically produce a picture of your prostate gland. Your health care provider may refer you to a specialist in kidney and prostate diseases (urologist). How is this treated? Once symptoms begin, your health care provider will monitor your condition (active surveillance or watchful waiting). Treatment for this condition will depend on the severity of your condition. Treatment may include:  Observation and yearly exams. This may be the only treatment needed if your condition and symptoms are mild.  Medicines to relieve your symptoms, including: ? Medicines to shrink the prostate. ? Medicines to relax the muscle of the prostate.  Surgery in severe cases. Surgery may include: ? Prostatectomy. In this procedure, the prostate tissue is removed completely through an open incision or with a laparoscope or robotics. ? Transurethral resection of the prostate (TURP). In this procedure, a tool is inserted through the opening at the tip of the penis (urethra). It is used to cut away tissue of the inner core of the prostate. The pieces are removed through the same opening of the penis. This removes the blockage. ? Transurethral incision (TUIP). In this procedure, small cuts are made in the prostate. This lessens   the prostate's pressure on the urethra. ? Transurethral microwave thermotherapy (TUMT). This procedure uses microwaves to create heat. The heat destroys and removes a small amount of prostate tissue. ? Transurethral needle ablation (TUNA). This procedure uses radio frequencies to destroy and remove a small amount of  prostate tissue. ? Interstitial laser coagulation (ILC). This procedure uses a laser to destroy and remove a small amount of prostate tissue. ? Transurethral electrovaporization (TUVP). This procedure uses electrodes to destroy and remove a small amount of prostate tissue. ? Prostatic urethral lift. This procedure inserts an implant to push the lobes of the prostate away from the urethra. Follow these instructions at home:  Take over-the-counter and prescription medicines only as told by your health care provider.  Monitor your symptoms for any changes. Contact your health care provider with any changes.  Avoid drinking large amounts of liquid before going to bed or out in public.  Avoid or reduce how much caffeine or alcohol you drink.  Give yourself time when you urinate.  Keep all follow-up visits as told by your health care provider. This is important. Contact a health care provider if:  You have unexplained back pain.  Your symptoms do not get better with treatment.  You develop side effects from the medicine you are taking.  Your urine becomes very dark or has a bad smell.  Your lower abdomen becomes distended and you have trouble passing your urine. Get help right away if:  You have a fever or chills.  You suddenly cannot urinate.  You feel lightheaded, or very dizzy, or you faint.  There are large amounts of blood or clots in the urine.  Your urinary problems become hard to manage.  You develop moderate to severe low back or flank pain. The flank is the side of your body between the ribs and the hip. These symptoms may represent a serious problem that is an emergency. Do not wait to see if the symptoms will go away. Get medical help right away. Call your local emergency services (911 in the [place] Do not drive yourself to the hospital. Summary  Benign prostatic hyperplasia (BPH) is an enlarged prostate that is caused by the normal aging process and not by  cancer.  An enlarged prostate can press on the urethra. This can make it hard to pass urine.  This condition is part of a normal aging process and is more likely to develop in men over the age of 50 years.  Get help right away if you suddenly cannot urinate. This information is not intended to replace advice given to you by your health care provider. Make sure you discuss any questions you have with your health care provider. Document Revised: 12/17/2017 Document Reviewed: 02/27/2016 Elsevier Patient Education  2020 [employer]  Erectile Dysfunction Erectile dysfunction (ED) is the inability to get or keep an erection in order to have sexual intercourse. Erectile dysfunction may include:  Inability to get an erection.  Lack of enough hardness of the erection to allow penetration.  Loss of the erection before sex is finished. What are the causes? This condition may be caused by:  Certain medicines, such as: ? Pain relievers. ? Antihistamines. ? Antidepressants. ? Blood pressure medicines. ? Water pills (diuretics). ? Ulcer medicines. ? Muscle relaxants. ? Drugs.  Excessive drinking.  Psychological causes, such as: ? Anxiety. ? Depression. ? Sadness. ? Exhaustion. ? Performance fear. ? Stress.  Physical causes, such as: ? Artery problems. This may include diabetes, smoking,  liver disease, or atherosclerosis. ? High blood pressure. ? Hormonal problems, such as low testosterone. ? Obesity. ? Nerve problems. This may include back or pelvic injuries, diabetes mellitus, multiple sclerosis, or Parkinson disease. What are the signs or symptoms? Symptoms of this condition include:  Inability to get an erection.  Lack of enough hardness of the erection to allow penetration.  Loss of the erection before sex is finished.  Normal erections at some times, but with frequent unsatisfactory episodes.  Low sexual satisfaction in either partner due to erection problems.  A  curved penis occurring with erection. The curve may cause pain or the penis may be too curved to allow for intercourse.  Never having nighttime erections. How is this diagnosed? This condition is often diagnosed by:  Performing a physical exam to find other diseases or specific problems with the penis.  Asking you detailed questions about the problem.  Performing blood tests to check for diabetes mellitus or to measure hormone levels.  Performing other tests to check for underlying health conditions.  Performing an ultrasound exam to check for scarring.  Performing a test to check blood flow to the penis.  Doing a sleep study at home to measure nighttime erections. How is this treated? This condition may be treated by:  Medicine taken by mouth to help you achieve an erection (oral medicine).  Hormone replacement therapy to replace low testosterone levels.  Medicine that is injected into the penis. Your health care provider may instruct you how to give yourself these injections at home.  Vacuum pump. This is a pump with a ring on it. The pump and ring are placed on the penis and used to create pressure that helps the penis become erect.  Penile implant surgery. In this procedure, you may receive: ? An inflatable implant. This consists of cylinders, a pump, and a reservoir. The cylinders can be inflated with a fluid that helps to create an erection, and they can be deflated after intercourse. ? A semi-rigid implant. This consists of two silicone rubber rods. The rods provide some rigidity. They are also flexible, so the penis can both curve downward in its normal position and become straight for sexual intercourse.  Blood vessel surgery, to improve blood flow to the penis. During this procedure, a blood vessel from a different part of the body is placed into the penis to allow blood to flow around (bypass) damaged or blocked blood vessels.  Lifestyle changes, such as exercising  more, losing weight, and quitting smoking. Follow these instructions at home: Medicines   Take over-the-counter and prescription medicines only as told by your health care provider. Do not increase the dosage without first discussing it with your health care provider.  If you are using self-injections, perform injections as directed by your health care provider. Make sure to avoid any veins that are on the surface of the penis. After giving an injection, apply pressure to the injection site for 5 minutes. General instructions  Exercise regularly, as directed by your health care provider. Work with your health care provider to lose weight, if needed.  Do not use any products that contain nicotine or tobacco, such as cigarettes and e-cigarettes. If you need help quitting, ask your health care provider.  Before using a vacuum pump, read the instructions that come with the pump and discuss any questions with your health care provider.  Keep all follow-up visits as told by your health care provider. This is important. Contact a health care  provider if:  You feel nauseous.  You vomit. Get help right away if:  You are taking oral or injectable medicines and you have an erection that lasts longer than 4 hours. If your health care provider is unavailable, go to the nearest emergency room for evaluation. An erection that lasts much longer than 4 hours can result in permanent damage to your penis.  You have severe pain in your groin or abdomen.  You develop redness or severe swelling of your penis.  You have redness spreading up into your groin or lower abdomen.  You are unable to urinate.  You experience chest pain or a rapid heart beat (palpitations) after taking oral medicines. Summary  Erectile dysfunction (ED) is the inability to get or keep an erection during sexual intercourse. This problem can usually be treated successfully.  This condition is diagnosed based on a physical exam,  your symptoms, and tests to determine the cause. Treatment varies depending on the cause, and may include medicines, hormone therapy, surgery, or vacuum pump.  You may need follow-up visits to make sure that you are using your medicines or devices correctly.  Get help right away if you are taking or injecting medicines and you have an erection that lasts longer than 4 hours. This information is not intended to replace advice given to you by your health care provider. Make sure you discuss any questions you have with your health care provider. Document Revised: 01/04/2017 Document Reviewed: 02/08/2016 Elsevier Patient Education  [place].  Prostatitis  Prostatitis is swelling or inflammation of the prostate gland. The prostate is a walnut-sized gland that is involved in the production of semen. It is located below a man's bladder, in front of the rectum. There are four types of prostatitis:  Chronic nonbacterial prostatitis. This is the most common type of prostatitis. It may be associated with a viral infection or autoimmune disorder.  Acute bacterial prostatitis. This is the least common type of prostatitis. It starts quickly and is usually associated with a bladder infection, high fever, and shaking chills. It can occur at any age.  Chronic bacterial prostatitis. This type usually results from acute bacterial prostatitis that happens repeatedly (is recurrent) or has not been treated properly. It can occur in men of any age but is most common among middle-aged men whose prostate has begun to get larger. The symptoms are not as severe as symptoms caused by acute bacterial prostatitis.  Prostatodynia or chronic pelvic pain syndrome (CPPS). This type is also called pelvic floor disorder. It is associated with increased muscular tone in the pelvis surrounding the prostate. What are the causes? Bacterial prostatitis is caused by infection from bacteria. Chronic nonbacterial prostatitis  may be caused by:  Urinary tract infections (UTIs).  Nerve damage.  A response by the body's disease-fighting system (autoimmune response).  Chemicals in the urine. The causes of the other types of prostatitis are usually not known. What are the signs or symptoms? Symptoms of this condition vary depending upon the type of prostatitis. If you have acute bacterial prostatitis, you may experience:  Urinary symptoms, such as: ? Painful urination. ? Burning during urination. ? Frequent and sudden urges to urinate. ? Inability to start urinating. ? A weak or interrupted stream of urine.  Vomiting.  Nausea.  Fever.  Chills.  Inability to empty the bladder completely.  Pain in the: ? Muscles or joints. ? Lower back. ? Lower abdomen. If you have any of the other types of prostatitis,  you may experience:  Urinary symptoms, such as: ? Sudden urges to urinate. ? Frequent urination. ? Difficulty starting urination. ? Weak urine stream. ? Dribbling after urination.  Discharge from the urethra. The urethra is a tube that opens at the end of the penis.  Pain in the: ? Testicles. ? Penis or tip of the penis. ? Rectum. ? Area in front of the rectum and below the scrotum (perineum).  Problems with sexual function.  Painful ejaculation.  Bloody semen. How is this diagnosed? This condition may be diagnosed based on:  A physical and medical exam.  Your symptoms.  A urine test to check for bacteria.  An exam in which a health care provider uses a finger to feel the prostate (digital rectal exam).  A test of a sample of semen.  Blood tests.  Ultrasound.  Removal of prostate tissue to be examined under a microscope (biopsy).  Tests to check how your body handles urine (urodynamic tests).  A test to look inside your bladder or urethra (cystoscopy). How is this treated? Treatment for this condition depends on the type of prostatitis. Treatment may  involve:  Medicines to relieve pain or inflammation.  Medicines to help relax your muscles.  Physical therapy.  Heat therapy.  Techniques to help you control certain body functions (biofeedback).  Relaxation exercises.  Antibiotic medicine, if your condition is caused by bacteria.  Warm water baths (sitz baths). Sitz baths help with relaxing your pelvic floor muscles, which helps to relieve pressure on the prostate. Follow these instructions at home:   Take over-the-counter and prescription medicines only as told by your health care provider.  If you were prescribed an antibiotic, take it as told by your health care provider. Do not stop taking the antibiotic even if you start to feel better.  If physical therapy, biofeedback, or relaxation exercises were prescribed, do exercises as instructed.  Take sitz baths as directed by your health care provider. For a sitz bath, sit in warm water that is deep enough to cover your hips and buttocks.  Keep all follow-up visits as told by your health care provider. This is important. Contact a health care provider if:  Your symptoms get worse.  You have a fever. Get help right away if:  You have chills.  You feel nauseous.  You vomit.  You feel light-headed or feel like you are going to faint.  You are unable to urinate.  You have blood or blood clots in your urine. This information is not intended to replace advice given to you by your health care provider. Make sure you discuss any questions you have with your health care provider. Document Revised: 04/06/2017 Document Reviewed: 10/13/2015 Elsevier Patient Education  2020 [employer].

## 2019-04-24 NOTE — Progress Notes (Signed)
Established Patient Office Visit  Subjective:  Patient ID: Corey [name], male    DOB: September 27, 1975  Age: 44 y.o. MRN: [id number]  CC:  Chief Complaint  Patient presents with  . Urinary Frequency    urgecy to urinate, flow not the same, erectile dysfunction symptoms x 1-2 months.     HPI Corey [name] presents for 6 to 8-week history of decreased force of urinary stream, frequency, some urgency and nocturia up to 2.  Denies discharge or burning.  No fevers chills nausea or vomiting.  Has not seen any blood in his stool.  No recent history of hemorrhoid.  Paternal aunt diagnosed with colon cancer at age 77.  She had succumbed from this illness.  Patient's father passed from kidney cancer.  Patient has been experiencing ED over the last several months.  He is compliant with his Avapro.  Past Medical History:  Diagnosis Date  . Hypertension     No past surgical history on file.  Family History  Problem Relation Age of Onset  . Arthritis Mother   . Hypertension Mother   . Cancer Father   . Hypertension Father   . Asthma Sister   . Asthma Sister     Social History   Socioeconomic History  . Marital status: Married    Spouse name: Not on file  . Number of children: Not on file  . Years of education: Not on file  . Highest education level: Not on file  Occupational History  . Not on file  Tobacco Use  . Smoking status: Never Smoker  . Smokeless tobacco: Never Used  Substance and Sexual Activity  . Alcohol use: No  . Drug use: No  . Sexual activity: Not on file  Other Topics Concern  . Not on file  Social History Narrative  . Not on file   Social Determinants of Health   Financial Resource Strain:   . Difficulty of Paying Living Expenses:   Food Insecurity:   . Worried About Programme researcher, broadcasting/film/video in the Last Year:   . Barista in the Last Year:   Transportation Needs:   . Freight forwarder (Medical):   Marland Kitchen Lack of Transportation (Non-Medical):   Physical  Activity:   . Days of Exercise per Week:   . Minutes of Exercise per Session:   Stress:   . Feeling of Stress :   Social Connections:   . Frequency of Communication with Friends and Family:   . Frequency of Social Gatherings with Friends and Family:   . Attends Religious Services:   . Active Member of Clubs or Organizations:   . Attends Banker Meetings:   Marland Kitchen Marital Status:   Intimate Partner Violence:   . Fear of Current or Ex-Partner:   . Emotionally Abused:   Marland Kitchen Physically Abused:   . Sexually Abused:     Outpatient Medications Prior to Visit  Medication Sig Dispense Refill  . irbesartan (AVAPRO) 300 MG tablet Take 1qd (Plz schedule appt for future fills) 90 tablet 0  . cyclobenzaprine (FLEXERIL) 10 MG tablet Take 1 tablet (10 mg total) by mouth 3 (three) times daily as needed for muscle spasms. (Patient not taking: Reported on 04/24/2019) 30 tablet 0  . ketorolac (TORADOL) 10 MG tablet Take 1 tablet (10 mg total) by mouth every 6 (six) hours as needed. (Patient not taking: Reported on 04/24/2019) 20 tablet 0  . mometasone (NASONEX) 50 MCG/ACT nasal spray Place  2 sprays into the nose daily. (Patient not taking: Reported on 04/24/2019) 17 g 6  . rizatriptan (MAXALT-MLT) 5 MG disintegrating tablet Take 1 tablet (5 mg total) by mouth as needed for migraine. May repeat in 2 hours if needed (Patient not taking: Reported on 04/24/2019) 10 tablet 1   No facility-administered medications prior to visit.    Allergies  Allergen Reactions  . Hydrochlorothiazide Other (See Comments)    Sun Sensitivity Sweating   . Ace Inhibitors Other (See Comments)    Other reaction(s): Cough (ALLERGY/intolerance)   . Morphine And Related Hives and Itching    ROS Review of Systems  Constitutional: Negative.   HENT: Negative.   Respiratory: Negative.   Cardiovascular: Negative.   Gastrointestinal: Negative for abdominal pain, anal bleeding, blood in stool, constipation, nausea and  vomiting.  Genitourinary: Positive for difficulty urinating, frequency and urgency. Negative for discharge, dysuria and hematuria.  Musculoskeletal: Negative for gait problem and joint swelling.  Skin: Negative.   Allergic/Immunologic: Negative for immunocompromised state.  Neurological: Negative for light-headedness and headaches.  Hematological: Negative.   Psychiatric/Behavioral: Negative.  Negative for dysphoric mood. The patient is not nervous/anxious.       Objective:    Physical Exam  Constitutional: He is oriented to person, place, and time. He appears well-developed and well-nourished. No distress.  HENT:  Head: Normocephalic and atraumatic.  Right Ear: External ear normal.  Left Ear: External ear normal.  Eyes: Conjunctivae are normal. Right eye exhibits no discharge. Left eye exhibits no discharge. No scleral icterus.  Neck: No JVD present. No tracheal deviation present. No thyromegaly present.  Cardiovascular: Normal rate, regular rhythm and normal heart sounds.  Pulmonary/Chest: Effort normal and breath sounds normal. No stridor.  Abdominal: Bowel sounds are normal. He exhibits no distension. There is no abdominal tenderness. There is no rebound and no guarding. Hernia confirmed negative in the right inguinal area and confirmed negative in the left inguinal area.  Genitourinary: Rectum:     Guaiac result positive.     No rectal mass, anal fissure, tenderness, external hemorrhoid, internal hemorrhoid or abnormal anal tone.  Prostate is enlarged and tender. Right testis shows no mass, no swelling and no tenderness. Right testis is descended. Left testis shows no mass, no swelling and no tenderness. Left testis is descended. Circumcised. No hypospadias, penile erythema or penile tenderness. No discharge found.     Musculoskeletal:        General: No edema.  Lymphadenopathy:    He has no cervical adenopathy.       Right: No inguinal adenopathy present.       Left: No  inguinal adenopathy present.  Neurological: He is alert and oriented to person, place, and time.  Skin: Skin is warm and dry. He is not diaphoretic.  Psychiatric: He has a normal mood and affect. His behavior is normal.    BP (!) 142/90   Pulse 94   Temp 97.6 F (36.4 C) (Tympanic)   Ht 6' (1.829 m)   Wt 258 lb 12.8 oz (117.4 kg)   SpO2 96%   BMI 35.10 kg/m  Wt Readings from Last 3 Encounters:  04/24/19 258 lb 12.8 oz (117.4 kg)  11/20/18 260 lb (117.9 kg)  02/14/18 264 lb 8 oz (120 kg)     There are no preventive care reminders to display for this patient.  There are no preventive care reminders to display for this patient.  No results found for: TSH Lab Results  Component  Value Date   WBC 7.3 09/11/2017   HGB 17.0 09/11/2017   HCT 49.6 09/11/2017   MCV 86.8 09/11/2017   PLT 252.0 09/11/2017   Lab Results  Component Value Date   NA 140 09/11/2017   K 4.2 09/11/2017   CO2 29 09/11/2017   GLUCOSE 101 (H) 09/11/2017   BUN 18 09/11/2017   CREATININE 0.99 09/11/2017   BILITOT 1.0 09/11/2017   ALKPHOS 64 09/11/2017   AST 15 09/11/2017   ALT 23 09/11/2017   PROT 6.8 09/11/2017   ALBUMIN 4.6 09/11/2017   CALCIUM 9.6 09/11/2017   GFR 87.13 09/11/2017   Lab Results  Component Value Date   CHOL 209 (H) 09/11/2017   Lab Results  Component Value Date   HDL 40.40 09/11/2017   Lab Results  Component Value Date   LDLCALC 133 (H) 09/11/2017   Lab Results  Component Value Date   TRIG 180.0 (H) 09/11/2017   Lab Results  Component Value Date   CHOLHDL 5 09/11/2017   No results found for: HGBA1C    Assessment & Plan:   Problem List Items Addressed This Visit      Cardiovascular and Mediastinum   Essential hypertension - Primary   Relevant Medications   sildenafil (REVATIO) 20 MG tablet   Other Relevant Orders   CBC   Comprehensive metabolic panel   Urinalysis, Routine w reflex microscopic     Genitourinary   Acute prostatitis   Relevant  Medications   sulfamethoxazole-trimethoprim (BACTRIM DS) 800-160 MG tablet   Other Relevant Orders   Urinalysis, Routine w reflex microscopic   Urine Culture     Other   Erectile dysfunction   Relevant Medications   sildenafil (REVATIO) 20 MG tablet   Heme positive stool   Relevant Orders   CBC   Ambulatory referral to Gastroenterology   Benign prostatic hyperplasia with weak urinary stream      Meds ordered this encounter  Medications  . sulfamethoxazole-trimethoprim (BACTRIM DS) 800-160 MG tablet    Sig: Take 1 tablet by mouth 2 (two) times daily for 10 days.    Dispense:  20 tablet    Refill:  0  . sildenafil (REVATIO) 20 MG tablet    Sig: May take 3-5 tablets daily as needed  45 minutes prior    Dispense:  50 tablet    Refill:  2    Follow-up: Return in about 2 weeks (around 05/08/2019).    Mliss Sax, MD

## 2019-04-25 LAB — URINE CULTURE
MICRO NUMBER:: 10271088
Result:: NO GROWTH
SPECIMEN QUALITY:: ADEQUATE

## 2019-04-28 ENCOUNTER — Ambulatory Visit (INDEPENDENT_AMBULATORY_CARE_PROVIDER_SITE_OTHER): Payer: 59 | Admitting: Gastroenterology

## 2019-04-28 ENCOUNTER — Encounter: Payer: Self-pay | Admitting: Gastroenterology

## 2019-04-28 ENCOUNTER — Other Ambulatory Visit: Payer: Self-pay

## 2019-04-28 VITALS — BP 134/94 | HR 88 | Temp 97.8°F | Ht 72.0 in | Wt 263.1 lb

## 2019-04-28 DIAGNOSIS — R194 Change in bowel habit: Secondary | ICD-10-CM

## 2019-04-28 DIAGNOSIS — Z8719 Personal history of other diseases of the digestive system: Secondary | ICD-10-CM

## 2019-04-28 DIAGNOSIS — R195 Other fecal abnormalities: Secondary | ICD-10-CM | POA: Diagnosis not present

## 2019-04-28 DIAGNOSIS — R1310 Dysphagia, unspecified: Secondary | ICD-10-CM

## 2019-04-28 DIAGNOSIS — Z01818 Encounter for other preprocedural examination: Secondary | ICD-10-CM

## 2019-04-28 DIAGNOSIS — Z800 Family history of malignant neoplasm of digestive organs: Secondary | ICD-10-CM

## 2019-04-28 MED ORDER — CLENPIQ 10-3.5-12 MG-GM -GM/160ML PO SOLN: 1.0000 | Freq: Once | ORAL | 0 refills | Status: AC

## 2019-04-28 NOTE — Patient Instructions (Signed)
You have been scheduled for an endoscopy and colonoscopy. Please follow the written instructions given to you at your visit today. Please pick up your prep supplies at the pharmacy within the next 1-3 days. If you use inhalers (even only as needed), please bring them with you on the day of your procedure. Your physician has requested that you go to www.startemmi.com and enter the access code given to you at your visit today. This web site gives a general overview about your procedure. However, you should still follow specific instructions given to you by our office regarding your preparation for the procedure.  It was a pleasure to see you today!  Vito Cirigliano, D.O.  

## 2019-04-28 NOTE — Progress Notes (Signed)
Chief Complaint: Heme positive stools  Referring Provider:     Mliss Sax, MD   HPI:    Corey [name] is a 44 y.o. male with a history hypertension referred to the Gastroenterology Clinic for evaluation of heme positive stools noted during evaluation for acute prostatitis- started on TMP-SMX (taking currently).  He otherwise denies any overt GI blood loss. Does endorse looser stools over the last 3-4 months, occurring intermittently. No change in stool frequency, and o/w denies abdominal pain, f/c/n/v.   Separately, he c/o intermittent solid food dysphagia, pointing to suprasternal notch.  No food impactions.  States he has a Hx of Barrett's Esophagus diagnosed at age 1. He previously had nocturnal reflux sxs, but no sxs in years, which he attributes to changing sleep pattern/position. Not taking any acid suppression therapy.  No subsequent EGD since then.   Labs on 04/24/2019: WBC 11.5, normal H/H, normal CMP.  No previous colonoscopy.  Family history notable for paternal aunt diagnosed with CRC at age 46.  Father with kidney cancer.  Past Medical History:  Diagnosis Date  . Hypertension   . Kidney stones      Past Surgical History:  Procedure Laterality Date  . ESOPHAGOGASTRODUODENOSCOPY     over 25 years ago. Had a stomach ulcer and possibly dx of barretts esophagus  . KNEE SURGERY Right    x2  . LITHOTRIPSY     x2-3    Family History  Problem Relation Age of Onset  . Arthritis Mother   . Hypertension Mother   . Hypertension Father   . Kidney cancer Father        died 71   . Asthma Sister   . Asthma Sister   . Colon cancer Paternal Aunt        died 35  . Esophageal cancer Neg Hx    Social History   Tobacco Use  . Smoking status: Never Smoker  . Smokeless tobacco: Never Used  Substance Use Topics  . Alcohol use: No  . Drug use: No   Current Outpatient Medications  Medication Sig Dispense Refill  . irbesartan (AVAPRO) 300 MG  tablet Take 1qd (Plz schedule appt for future fills) 90 tablet 0  . sildenafil (REVATIO) 20 MG tablet May take 3-5 tablets daily as needed  45 minutes prior 50 tablet 2  . sulfamethoxazole-trimethoprim (BACTRIM DS) 800-160 MG tablet Take 1 tablet by mouth 2 (two) times daily for 10 days. 20 tablet 0   No current facility-administered medications for this visit.   Allergies  Allergen Reactions  . Hydrochlorothiazide Other (See Comments)    Sun Sensitivity Sweating   . Ace Inhibitors Other (See Comments)    Other reaction(s): Cough (ALLERGY/intolerance)   . Morphine And Related Hives and Itching     Review of Systems: All systems reviewed and negative except where noted in HPI.     Physical Exam:    Wt Readings from Last 3 Encounters:  04/28/19 263 lb 2 oz (119.4 kg)  04/24/19 258 lb 12.8 oz (117.4 kg)  11/20/18 260 lb (117.9 kg)    BP (!) 134/94   Pulse 88   Temp 97.8 F (36.6 C)   Ht 6' (1.829 m)   Wt 263 lb 2 oz (119.4 kg)   BMI 35.69 kg/m  Constitutional:  Pleasant, in no acute distress. Psychiatric: Normal mood and affect. Behavior is normal. EENT: Pupils normal.  Conjunctivae  are normal. No scleral icterus. Neck supple. No cervical LAD. Cardiovascular: Normal rate, regular rhythm. No edema Pulmonary/chest: Effort normal and breath sounds normal. No wheezing, rales or rhonchi. Abdominal: Soft, nondistended, nontender. Bowel sounds active throughout. There are no masses palpable. No hepatomegaly. Neurological: Alert and oriented to person place and time. Skin: Skin is warm and dry. No rashes noted.   ASSESSMENT AND PLAN;   1) Heme positive stool: -Colonoscopy to evaluate etiology -If clinically significant hemorrhoids, can plan for hemorrhoid band ligation as appropriate  2) Dysphagia 3) History of Barrett's esophagus: -EGD with esophageal dilation as appropriate -Evaluate for evidence of Barrett's esophagus.  Reportedly was diagnosed with BE in his early  69s, with no subsequent EGD.  He is otherwise without reflux symptoms -Evaluate for erosive esophagitis, LES laxity, hiatal hernia time of EGD -Plan for future acid suppression therapy pending endoscopic findings  4) Change in bowel habits: -Intermittently loose stools over the last 3-4 months -Diet for mucosal/luminal pathology at time of EGD/colonoscopy -Duodenal biopsies and random/directed colonic biopsies  5) Family history of colon cancer: -Paternal aunt diagnosed with CRC at age 59.  Otherwise no first-degree relatives with CRC -Colonoscopy as above  The indications, risks, and benefits of EGD and colonoscopy were explained to the patient in detail. Risks include but are not limited to bleeding, perforation, adverse reaction to medications, and cardiopulmonary compromise. Sequelae include but are not limited to the possibility of surgery, hositalization, and mortality. The patient verbalized understanding and wished to proceed. All questions answered, referred to scheduler and bowel prep ordered. Further recommendations pending results of the exam.     Lavena Bullion, DO, FACG  04/28/2019, 9:26 AM   Ethelene Hal Mortimer Fries,*

## 2019-05-07 ENCOUNTER — Encounter: Payer: Self-pay | Admitting: Gastroenterology

## 2019-05-12 ENCOUNTER — Ambulatory Visit: Payer: 59 | Admitting: Family Medicine

## 2019-05-20 ENCOUNTER — Ambulatory Visit (INDEPENDENT_AMBULATORY_CARE_PROVIDER_SITE_OTHER): Payer: 59

## 2019-05-20 ENCOUNTER — Other Ambulatory Visit: Payer: Self-pay | Admitting: Gastroenterology

## 2019-05-20 DIAGNOSIS — Z1159 Encounter for screening for other viral diseases: Secondary | ICD-10-CM

## 2019-05-20 LAB — SARS CORONAVIRUS 2 (TAT 6-24 HRS): SARS Coronavirus 2: NEGATIVE

## 2019-05-22 ENCOUNTER — Ambulatory Visit (AMBULATORY_SURGERY_CENTER): Payer: 59 | Admitting: Gastroenterology

## 2019-05-22 ENCOUNTER — Encounter: Payer: Self-pay | Admitting: Gastroenterology

## 2019-05-22 ENCOUNTER — Other Ambulatory Visit: Payer: Self-pay

## 2019-05-22 VITALS — BP 147/99 | HR 78 | Temp 97.3°F | Resp 18 | Ht 72.0 in | Wt 263.0 lb

## 2019-05-22 DIAGNOSIS — R1310 Dysphagia, unspecified: Secondary | ICD-10-CM

## 2019-05-22 DIAGNOSIS — K222 Esophageal obstruction: Secondary | ICD-10-CM | POA: Diagnosis not present

## 2019-05-22 DIAGNOSIS — R194 Change in bowel habit: Secondary | ICD-10-CM

## 2019-05-22 DIAGNOSIS — R195 Other fecal abnormalities: Secondary | ICD-10-CM | POA: Diagnosis present

## 2019-05-22 DIAGNOSIS — K2100 Gastro-esophageal reflux disease with esophagitis, without bleeding: Secondary | ICD-10-CM

## 2019-05-22 DIAGNOSIS — K2981 Duodenitis with bleeding: Secondary | ICD-10-CM | POA: Diagnosis not present

## 2019-05-22 DIAGNOSIS — Z8719 Personal history of other diseases of the digestive system: Secondary | ICD-10-CM

## 2019-05-22 DIAGNOSIS — K2970 Gastritis, unspecified, without bleeding: Secondary | ICD-10-CM

## 2019-05-22 DIAGNOSIS — K2980 Duodenitis without bleeding: Secondary | ICD-10-CM

## 2019-05-22 DIAGNOSIS — K648 Other hemorrhoids: Secondary | ICD-10-CM

## 2019-05-22 DIAGNOSIS — K5730 Diverticulosis of large intestine without perforation or abscess without bleeding: Secondary | ICD-10-CM

## 2019-05-22 DIAGNOSIS — K2951 Unspecified chronic gastritis with bleeding: Secondary | ICD-10-CM | POA: Diagnosis not present

## 2019-05-22 DIAGNOSIS — K640 First degree hemorrhoids: Secondary | ICD-10-CM

## 2019-05-22 HISTORY — PX: COLONOSCOPY: SHX174

## 2019-05-22 HISTORY — PX: UPPER GASTROINTESTINAL ENDOSCOPY: SHX188

## 2019-05-22 MED ORDER — SODIUM CHLORIDE 0.9 % IV SOLN: 500.0000 mL | Freq: Once | INTRAVENOUS | Status: DC

## 2019-05-22 MED ORDER — PANTOPRAZOLE SODIUM 40 MG PO TBEC: DELAYED_RELEASE_TABLET | ORAL | 1 refills | Status: DC

## 2019-05-22 NOTE — Op Note (Signed)
[facility] Patient Name: Corey [name] Procedure Date: 05/22/2019 3:00 PM MRN: [id number] Endoscopist: Doristine Locks , MD Age: 44 Referring MD:  Date of Birth: Jun 12, 1975 Gender: Male Account #: [id number] Procedure:                Colonoscopy Indications:              Heme positive stool, Change in bowel habits,                            Chronic diarrhea, Diarrhea Medicines:                Monitored Anesthesia Care Procedure:                Pre-Anesthesia Assessment:                           - Prior to the procedure, a History and Physical                            was performed, and patient medications and                            allergies were reviewed. The patient's tolerance of                            previous anesthesia was also reviewed. The risks                            and benefits of the procedure and the sedation                            options and risks were discussed with the patient.                            All questions were answered, and informed consent                            was obtained. Prior Anticoagulants: The patient has                            taken no previous anticoagulant or antiplatelet                            agents. ASA Grade Assessment: II - A patient with                            mild systemic disease. After reviewing the risks                            and benefits, the patient was deemed in                            satisfactory condition to undergo the procedure.  After obtaining informed consent, the colonoscope                            was passed under direct vision. Throughout the                            procedure, the patient's blood pressure, pulse, and                            oxygen saturations were monitored continuously. The                            Colonoscope was introduced through the anus and                            advanced to the the cecum, identified by                           appendiceal orifice and ileocecal valve. The                            colonoscopy was performed without difficulty. The                            patient tolerated the procedure well. The quality                            of the bowel preparation was adequate. The                            ileocecal valve, appendiceal orifice, and rectum                            were photographed. Scope In: 3:22:37 PM Scope Out: 3:38:18 PM Scope Withdrawal Time: 0 hours 14 minutes 5 seconds  Total Procedure Duration: 0 hours 15 minutes 41 seconds  Findings:                 The perianal and digital rectal examinations were                            normal.                           Multiple small and large-mouthed diverticula were                            found in the sigmoid colon.                           Normal mucosa was found in the entire colon.                            Biopsies for histology were taken with a cold  forceps from the right colon and left colon for                            evaluation of microscopic colitis. Estimated blood                            loss was minimal.                           Non-bleeding internal hemorrhoids were found during                            retroflexion. The hemorrhoids were small. Complications:            No immediate complications. Estimated Blood Loss:     Estimated blood loss was minimal. Impression:               - Diverticulosis in the sigmoid colon.                           - Normal mucosa in the entire examined colon.                            Biopsied.                           - Non-bleeding internal hemorrhoids. Recommendation:           - Patient has a contact number available for                            emergencies. The signs and symptoms of potential                            delayed complications were discussed with the                            patient. Return to  normal activities tomorrow.                            Written discharge instructions were provided to the                            patient.                           - Resume previous diet.                           - Continue present medications.                           - Await pathology results.                           - Repeat colonoscopy in 10 years for screening  purposes.                           - Return to GI clinic at appointment to be                            scheduled.                           - Internal hemorrhoids were noted on this study and                            may be amenable to hemorrhoid band ligation. If you                            are interested in further treatment of these                            hemorrhoids with band ligation, please contact my                            clinic to set up an appointment for evaluation and                            treatment. Gerrit Heck, MD 05/22/2019 4:01:44 PM

## 2019-05-22 NOTE — Progress Notes (Signed)
PT taken to PACU. Monitors in place. VSS. Report given to RN. 

## 2019-05-22 NOTE — Patient Instructions (Addendum)
YOU HAD AN ENDOSCOPIC PROCEDURE TODAY AT THE [facility]:   Refer to the procedure report that was given to you for any specific questions about what was found during the examination.  If the procedure report does not answer your questions, please call your gastroenterologist to clarify.  If you requested that your care partner not be given the details of your procedure findings, then the procedure report has been included in a sealed envelope for you to review at your convenience later.  YOU SHOULD EXPECT: Some feelings of bloating in the abdomen. Passage of more gas than usual.  Walking can help get rid of the air that was put into your GI tract during the procedure and reduce the bloating. If you had a lower endoscopy (such as a colonoscopy or flexible sigmoidoscopy) you may notice spotting of blood in your stool or on the toilet paper. If you underwent a bowel prep for your procedure, you may not have a normal bowel movement for a few days.  Please Note:  You might notice some irritation and congestion in your nose or some drainage.  This is from the oxygen used during your procedure.  There is no need for concern and it should clear up in a day or so.  SYMPTOMS TO REPORT IMMEDIATELY:   Following lower endoscopy (colonoscopy or flexible sigmoidoscopy):  Excessive amounts of blood in the stool  Significant tenderness or worsening of abdominal pains  Swelling of the abdomen that is new, acute  Fever of 100F or higher   Following upper endoscopy (EGD)  Vomiting of blood or coffee ground material  New chest pain or pain under the shoulder blades  Painful or persistently difficult swallowing  New shortness of breath  Fever of 100F or higher  Black, tarry-looking stools  For urgent or emergent issues, a gastroenterologist can be reached at any hour by calling [phone]. Do not use MyChart messaging for urgent concerns.    DIET:  Follow Dilation Diet.  ACTIVITY:  You  should plan to take it easy for the rest of today and you should NOT DRIVE or use heavy machinery until tomorrow (because of the sedation medicines used during the test).    FOLLOW UP: Our staff will call the number listed on your records 48-72 hours following your procedure to check on you and address any questions or concerns that you may have regarding the information given to you following your procedure. If we do not reach you, we will leave a message.  We will attempt to reach you two times.  During this call, we will ask if you have developed any symptoms of COVID 19. If you develop any symptoms (ie: fever, flu-like symptoms, shortness of breath, cough etc.) before then, please call [phone].  If you test positive for Covid 19 in the 2 weeks post procedure, please call and report this information to [place].    If any biopsies were taken you will be contacted by phone or by letter within the next 1-3 weeks.  Please call us at [phone] if you have not heard about the biopsies in 3 weeks.    SIGNATURES/CONFIDENTIALITY: You and/or your care partner have signed paperwork which will be entered into your electronic medical record.  These signatures attest to the fact that that the information above on your After Visit Summary has been reviewed and is understood.  Full responsibility of the confidentiality of this discharge information lies with you and/or your care-partner.  Resume medications. Information given on Dilation diet. And esophagitis.

## 2019-05-22 NOTE — Op Note (Signed)
[facility] Patient Name: Corey [name] Procedure Date: 05/22/2019 3:00 PM MRN: [id number] Endoscopist: Gerrit Heck , MD Age: 44 Referring MD:  Date of Birth: 06-06-1975 Gender: Male Account #: [id number] Procedure:                Upper GI endoscopy Indications:              Dysphagia, Suspected esophageal reflux, Diarrhea Medicines:                Monitored Anesthesia Care Procedure:                Pre-Anesthesia Assessment:                           - Prior to the procedure, a History and Physical                            was performed, and patient medications and                            allergies were reviewed. The patient's tolerance of                            previous anesthesia was also reviewed. The risks                            and benefits of the procedure and the sedation                            options and risks were discussed with the patient.                            All questions were answered, and informed consent                            was obtained. Prior Anticoagulants: The patient has                            taken no previous anticoagulant or antiplatelet                            agents. ASA Grade Assessment: II - A patient with                            mild systemic disease. After reviewing the risks                            and benefits, the patient was deemed in                            satisfactory condition to undergo the procedure.                           After obtaining informed consent, the endoscope was  passed under direct vision. Throughout the                            procedure, the patient's blood pressure, pulse, and                            oxygen saturations were monitored continuously. The                            Endoscope was introduced through the mouth, and                            advanced to the second part of duodenum. The upper                            GI  endoscopy was accomplished without difficulty.                            The patient tolerated the procedure well. Scope In: Scope Out: Findings:                 LA Grade A (one or more mucosal breaks less than 5                            mm, not extending between tops of 2 mucosal folds)                            esophagitis with no bleeding was found 40 cm from                            the incisors.                           One benign-appearing, intrinsic mild stenosis was                            found 39 cm from the incisors. This stenosis                            measured less than one cm (in length). The stenosis                            was traversed. The scope was withdrawn. Dilation                            was performed with a Maloney dilator with mild                            resistance at 54 Fr. The dilation site was examined                            following endoscope reinsertion and showed no  bleeding, mucosal tear or perforation. This was                            biopsied with a cold forceps for further fracturing                            of the ring. Estimated blood loss was minimal.                           Mucosal changes including feline appearance and                            longitudinal furrows were found in the lower third                            of the esophagus. Biopsies were obtained from the                            proximal and distal esophagus with cold forceps for                            histology of suspected eosinophilic esophagitis.                            Estimated blood loss was minimal.                           Scattered minimal inflammation characterized by                            erythema was found in the gastric fundus and in the                            gastric body. Biopsies were taken with a cold                            forceps for Helicobacter pylori testing. Estimated                             blood loss was minimal.                           The incisura, gastric antrum and pylorus were                            normal. Biopsies were taken with a cold forceps for                            Helicobacter pylori testing. Estimated blood loss                            was minimal.  The second portion of the duodenum was normal.                            Biopsies for histology were taken with a cold                            forceps for evaluation of celiac disease. Estimated                            blood loss was minimal.                           Localized inflammation characterized by erosions                            and erythema was found in the duodenal bulb.                            Biopsies were taken with a cold forceps for                            histology. Estimated blood loss was minimal. Complications:            No immediate complications. Estimated Blood Loss:     Estimated blood loss was minimal. Impression:               - LA Grade A reflux esophagitis with no bleeding.                           - Benign-appearing esophageal stenosis. Dilated.                            Biopsied.                           - Esophageal mucosal changes suspicious for                            eosinophilic esophagitis. Biopsied.                           - Gastritis. Biopsied.                           - Normal incisura, antrum and pylorus. Biopsied.                           - Normal second portion of the duodenum. Biopsied.                           - Duodenitis. Biopsied. Recommendation:           - Patient has a contact number available for                            emergencies. The signs and symptoms of potential  delayed complications were discussed with the                            patient. Return to normal activities tomorrow.                            Written discharge instructions were  provided to the                            patient.                           - Resume previous diet.                           - Continue present medications.                           - Await pathology results.                           - Use Protonix (pantoprazole) 40 mg PO BID for 8                            weeks, then reduce to 40 mg/day. Doristine LocksVito Daeshawn Redmann, MD 05/22/2019 3:58:38 PM

## 2019-05-22 NOTE — Progress Notes (Signed)
Called to room to assist during endoscopic procedure.  Patient ID and intended procedure confirmed with present staff. Received instructions for my participation in the procedure from the performing physician.  

## 2019-05-22 NOTE — Progress Notes (Signed)
Pt's states no medical or surgical changes since previsit or office visit.  Vitals KA Temp JB 

## 2019-05-26 ENCOUNTER — Telehealth: Payer: Self-pay | Admitting: *Deleted

## 2019-05-26 NOTE — Telephone Encounter (Signed)
  Follow up Call-  Call back number 05/22/2019  Post procedure Call Back phone  # [phone]  Permission to leave phone message Yes  Some recent data might be hidden     Patient questions:  Do you have a fever, pain , or abdominal swelling? No. Pain Score  0 *  Have you tolerated food without any problems? Yes.    Have you been able to return to your normal activities? Yes.    Do you have any questions about your discharge instructions: Diet   No. Medications  No. Follow up visit  No.  Do you have questions or concerns about your Care? No.  Actions: * If pain score is 4 or above: No action needed, pain <4.   1. Have you developed a fever since your procedure? no  2.   Have you had an respiratory symptoms (SOB or cough) since your procedure? no  3.   Have you tested positive for COVID 19 since your procedure no  4.   Have you had any family members/close contacts diagnosed with the COVID 19 since your procedure?  no   If yes to any of these questions please route to Laverna Peace, RN and Charlett Lango, RN

## 2019-06-09 ENCOUNTER — Other Ambulatory Visit: Payer: Self-pay

## 2019-06-09 ENCOUNTER — Telehealth (INDEPENDENT_AMBULATORY_CARE_PROVIDER_SITE_OTHER): Payer: 59 | Admitting: Family Medicine

## 2019-06-09 ENCOUNTER — Encounter: Payer: Self-pay | Admitting: Family Medicine

## 2019-06-09 VITALS — Temp 99.9°F | Ht 72.0 in | Wt 260.0 lb

## 2019-06-09 DIAGNOSIS — U071 COVID-19: Secondary | ICD-10-CM

## 2019-06-09 HISTORY — DX: COVID-19: U07.1

## 2019-06-09 NOTE — Patient Instructions (Signed)
10 Things You Can Do to Manage Your COVID-19 Symptoms at Home If you have possible or confirmed COVID-19: 1. Stay home from work and school. And stay away from other public places. If you must go out, avoid using any kind of public transportation, ridesharing, or taxis. 2. Monitor your symptoms carefully. If your symptoms get worse, call your healthcare provider immediately. 3. Get rest and stay hydrated. 4. If you have a medical appointment, call the healthcare provider ahead of time and tell them that you have or may have COVID-19. 5. For medical emergencies, call 911 and notify the dispatch personnel that you have or may have COVID-19. 6. Cover your cough and sneezes with a tissue or use the inside of your elbow. 7. Wash your hands often with soap and water for at least 20 seconds or clean your hands with an alcohol-based hand sanitizer that contains at least 60% alcohol. 8. As much as possible, stay in a specific room and away from other people in your home. Also, you should use a separate bathroom, if available. If you need to be around other people in or outside of the home, wear a mask. 9. Avoid sharing personal items with other people in your household, like dishes, towels, and bedding. 10. Clean all surfaces that are touched often, like counters, tabletops, and doorknobs. Use household cleaning sprays or wipes according to the label instructions. [email or web address].[employer].uk 08/06/2018 This information is not intended to replace advice given to you by your health care provider. Make sure you discuss any questions you have with your health care provider. Document Revised: 01/08/2019 Document Reviewed: 01/08/2019 Elsevier Patient Education  2020 [employer].  COVID-19 Frequently Asked Questions COVID-19 (coronavirus disease) is an infection that is caused by a large family of viruses. Some viruses cause illness in people and others cause illness in animals like camels, cats, and bats. In some  cases, the viruses that cause illness in animals can spread to humans. Where did the coronavirus come from? In December 2019, [place] told the [employer] [facility]) of several cases of lung disease (human respiratory illness). These cases were linked to an open seafood and livestock market in the city of [place]. The link to the seafood and livestock market suggests that the virus may have spread from animals to humans. However, since that first outbreak in December, the virus has also been shown to spread from person to person. What is the name of the disease and the virus? Disease name Early on, this disease was called novel coronavirus. This is because scientists determined that the disease was caused by a new (novel) respiratory virus. The World Health Organization [facility]) has now named the disease COVID-19, or coronavirus disease. Virus name The virus that causes the disease is called severe acute respiratory syndrome coronavirus 2 (SARS-CoV-2). More information on disease and virus naming World Health Organization [facility]): www.who.int/emergencies/diseases/novel-coronavirus-2019/technical-guidance/naming-the-coronavirus-disease-(covid-2019)-and-the-virus-that-causes-it Who is at risk for complications from coronavirus disease? Some people may be at higher risk for complications from coronavirus disease. This includes older adults and people who have chronic diseases, such as heart disease, diabetes, and lung disease. If you are at higher risk for complications, take these extra precautions:  Stay home as much as possible.  Avoid social gatherings and travel.  Avoid close contact with others. Stay at least 6 ft (2 m) away from others, if possible.  Wash your hands often with soap and water for at least 20 seconds.  Avoid touching your face, mouth, nose, or eyes.  Keep supplies on hand at home, such as food, medicine, and cleaning supplies.  If you must go out in public, wear a cloth  face covering or face mask. Make sure your mask covers your nose and mouth. How does coronavirus disease spread? The virus that causes coronavirus disease spreads easily from person to person (is contagious). You may catch the virus by:  Breathing in droplets from an infected person. Droplets can be spread by a person breathing, speaking, singing, coughing, or sneezing.  Touching something, like a table or a doorknob, that was exposed to the virus (contaminated) and then touching your mouth, nose, or eyes. Can I get the virus from touching surfaces or objects? There is still a lot that we do not know about the virus that causes coronavirus disease. Scientists are basing a lot of information on what they know about similar viruses, such as:  Viruses cannot generally survive on surfaces for long. They need a human body (host) to survive.  It is more likely that the virus is spread by close contact with people who are sick (direct contact), such as through: ? Shaking hands or hugging. ? Breathing in respiratory droplets that travel through the air. Droplets can be spread by a person breathing, speaking, singing, coughing, or sneezing.  It is less likely that the virus is spread when a person touches a surface or object that has the virus on it (indirect contact). The virus may be able to enter the body if the person touches a surface or object and then touches his or her face, eyes, nose, or mouth. Can a person spread the virus without having symptoms of the disease? It may be possible for the virus to spread before a person has symptoms of the disease, but this is most likely not the main way the virus is spreading. It is more likely for the virus to spread by being in close contact with people who are sick and breathing in the respiratory droplets spread by a person breathing, speaking, singing, coughing, or sneezing. What are the symptoms of coronavirus disease? Symptoms vary from person to  person and can range from mild to severe. Symptoms may include:  Fever or chills.  Cough.  Difficulty breathing or feeling short of breath.  Headaches, body aches, or muscle aches.  Runny or stuffy (congested) nose.  Sore throat.  New loss of taste or smell.  Nausea, vomiting, or diarrhea. These symptoms can appear anywhere from 2 to 14 days after you have been exposed to the virus. Some people may not have any symptoms. If you develop symptoms, call your health care provider. People with severe symptoms may need hospital care. Should I be tested for this virus? Your health care provider will decide whether to test you based on your symptoms, history of exposure, and your risk factors. How does a health care provider test for this virus? Health care providers will collect samples to send for testing. Samples may include:  Taking a swab of fluid from the back of your nose and throat, your nose, or your throat.  Taking fluid from the lungs by having you cough up mucus (sputum) into a sterile cup.  Taking a blood sample. Is there a treatment or vaccine for this virus? Currently, there is no vaccine to prevent coronavirus disease. Also, there are no medicines like antibiotics or antivirals to treat the virus. A person who becomes sick is given supportive care, which means rest and fluids. A person may also  relieve his or her symptoms by using over-the-counter medicines that treat sneezing, coughing, and runny nose. These are the same medicines that a person takes for the common cold. If you develop symptoms, call your health care provider. People with severe symptoms may need hospital care. What can I do to protect myself and my family from this virus?     You can protect yourself and your family by taking the same actions that you would take to prevent the spread of other viruses. Take the following actions:  Wash your hands often with soap and water for at least 20 seconds. If soap  and water are not available, use alcohol-based hand sanitizer.  Avoid touching your face, mouth, nose, or eyes.  Cough or sneeze into a tissue, sleeve, or elbow. Do not cough or sneeze into your hand or the air. ? If you cough or sneeze into a tissue, throw it away immediately and wash your hands.  Disinfect objects and surfaces that you frequently touch every day.  Stay away from people who are sick.  Avoid going out in public, follow guidance from your state and local health authorities.  Avoid crowded indoor spaces. Stay at least 6 ft (2 m) away from others.  If you must go out in public, wear a cloth face covering or face mask. Make sure your mask covers your nose and mouth.  Stay home if you are sick, except to get medical care. Call your health care provider before you get medical care. Your health care provider will tell you how long to stay home.  Make sure your vaccines are up to date. Ask your health care provider what vaccines you need. What should I do if I need to travel? Follow travel recommendations from your local health authority, the CDC, and WHO. Travel information and advice  Centers for Disease Control and Prevention (CDC): www.cdc.gov/coronavirus/2019-ncov/travelers/index.html  World Health Organization (WHO): www.who.int/emergencies/diseases/novel-coronavirus-2019/travel-advice Know the risks and take action to protect your health  You are at higher risk of getting coronavirus disease if you are traveling to areas with an outbreak or if you are exposed to travelers from areas with an outbreak.  Wash your hands often and practice good hygiene to lower the risk of catching or spreading the virus. What should I do if I am sick? General instructions to stop the spread of infection  Wash your hands often with soap and water for at least 20 seconds. If soap and water are not available, use alcohol-based hand sanitizer.  Cough or sneeze into a tissue, sleeve, or  elbow. Do not cough or sneeze into your hand or the air.  If you cough or sneeze into a tissue, throw it away immediately and wash your hands.  Stay home unless you must get medical care. Call your health care provider or local health authority before you get medical care.  Avoid public areas. Do not take public transportation, if possible.  If you can, wear a mask if you must go out of the house or if you are in close contact with someone who is not sick. Make sure your mask covers your nose and mouth. Keep your home clean  Disinfect objects and surfaces that are frequently touched every day. This may include: ? Counters and tables. ? Doorknobs and light switches. ? Sinks and faucets. ? Electronics such as phones, remote controls, keyboards, computers, and tablets.  Wash dishes in hot, soapy water or use a dishwasher. Air-dry your dishes.  Wash laundry in   hot water. Prevent infecting other household members  Let healthy household members care for children and pets, if possible. If you have to care for children or pets, wash your hands often and wear a mask.  Sleep in a different bedroom or bed, if possible.  Do not share personal items, such as razors, toothbrushes, deodorant, combs, brushes, towels, and washcloths. Where to find more information Centers for Disease Control and Prevention (CDC)  Information and news updates: https://www.butler-gonzalez.com/ World Health Organization [facility])  Information and news updates: MissExecutive.com.ee  Coronavirus health topic: https://www.castaneda.info/  Questions and answers on COVID-19: OpportunityDebt.at  Global tracker: who.sprinklr.com American Academy of Pediatrics (AAP)  Information for families: www.healthychildren.org/English/health-issues/conditions/chest-lungs/Pages/2019-Novel-Coronavirus.aspx The coronavirus situation is changing rapidly. Check  your local health authority website or the CDC and [facility] websites for updates and news. When should I contact a health care provider?  Contact your health care provider if you have symptoms of an infection, such as fever or cough, and you: ? Have been near anyone who is known to have coronavirus disease. ? Have come into contact with a person who is suspected to have coronavirus disease. ? Have traveled to an area where there is an outbreak of COVID-19. When should I get emergency medical care?  Get help right away by calling your local emergency services (911 in the [place] if you have: ? Trouble breathing. ? Pain or pressure in your chest. ? Confusion. ? Blue-tinged lips and fingernails. ? Difficulty waking from sleep. ? Symptoms that get worse. Let the emergency medical personnel know if you think you have coronavirus disease. Summary  A new respiratory virus is spreading from person to person and causing COVID-19 (coronavirus disease).  The virus that causes COVID-19 appears to spread easily. It spreads from one person to another through droplets from breathing, speaking, singing, coughing, or sneezing.  Older adults and those with chronic diseases are at higher risk of disease. If you are at higher risk for complications, take extra precautions.  There is currently no vaccine to prevent coronavirus disease. There are no medicines, such as antibiotics or antivirals, to treat the virus.  You can protect yourself and your family by washing your hands often, avoiding touching your face, and covering your coughs and sneezes. This information is not intended to replace advice given to you by your health care provider. Make sure you discuss any questions you have with your health care provider. Document Revised: 11/21/2018 Document Reviewed: 05/20/2018 Elsevier Patient Education  [place] if You Are Sick If you are sick with COVID-19 or think you  might have COVID-19, follow the steps below to care for yourself and to help protect other people in your home and community. Stay home except to get medical care.  Stay home. Most people with COVID-19 have mild illness and are able to recover at home without medical care. Do not leave your home, except to get medical care. Do not visit public areas.  Take care of yourself. Get rest and stay hydrated. Take over-the-counter medicines, such as acetaminophen, to help you feel better.  Stay in touch with your doctor. Call before you get medical care. Be sure to get care if you have trouble breathing, or have any other emergency warning signs, or if you think it is an emergency.  Avoid public transportation, ride-sharing, or taxis. Separate yourself from other people and pets in your home.  As much as possible, stay in a specific room and away from  other people and pets in your home. Also, you should use a separate bathroom, if available. If you need to be around other people or animals in or outside of the home, wear a mask. ? See COVID-19 and Animals if you have questions about AnniversaryBlowout.com.ee. ? Additional guidance is available for those living in close quarters. (http://white.info/.html) and shared housing (DisasterTalk.co.uk). Monitor your symptoms.  Symptoms of COVID-19 include fever, cough, and shortness of breath but other symptoms may be present as well.  Follow care instructions from your healthcare provider and local health department. Your local health authorities will give instructions on checking your symptoms and reporting information. When to Seek Emergency Medical Attention Look for emergency warning signs* for COVID-19. If someone is showing any of these signs, seek emergency medical care  immediately:  Trouble breathing  Persistent pain or pressure in the chest  New confusion  Bluish lips or face  Inability to wake or stay awake *This list is not all possible symptoms. Please call your medical provider for any other symptoms that are severe or concerning to you. Call 911 or call ahead to your local emergency facility: Notify the operator that you are seeking care for someone who has or may have COVID-19. Call ahead before visiting your doctor.  Call ahead. Many medical visits for routine care are being postponed or done by phone or telemedicine.  If you have a medical appointment that cannot be postponed, call your doctor's office, and tell them you have or may have COVID-19. If you are sick, wear a mask over your nose and mouth.  You should wear a mask over your nose and mouth if you must be around other people or animals, including pets (even at home).  You don't need to wear the mask if you are alone. If you can't put on a mask (because of trouble breathing for example), cover your coughs and sneezes in some other way. Try to stay at least 6 feet away from other people. This will help protect the people around you.  Masks should not be placed on young children under age 59 years, anyone who has trouble breathing, or anyone who is not able to remove the mask without help. Note: During the COVID-19 pandemic, medical grade facemasks are reserved for healthcare workers and some first responders. You may need to make a mask using a scarf or bandana. Cover your coughs and sneezes.  Cover your mouth and nose with a tissue when you cough or sneeze.  Throw used tissues in a lined trash can.  Immediately wash your hands with soap and water for at least 20 seconds. If soap and water are not available, clean your hands with an alcohol-based hand sanitizer that contains at least 60% alcohol. Clean your hands often.  Wash your hands often with soap and water for at least 20  seconds. This is especially important after blowing your nose, coughing, or sneezing; going to the bathroom; and before eating or preparing food.  Use hand sanitizer if soap and water are not available. Use an alcohol-based hand sanitizer with at least 60% alcohol, covering all surfaces of your hands and rubbing them together until they feel dry.  Soap and water are the best option, especially if your hands are visibly dirty.  Avoid touching your eyes, nose, and mouth with unwashed hands. Avoid sharing personal household items.  Do not share dishes, drinking glasses, cups, eating utensils, towels, or bedding with other people in your home.  Wash these  items thoroughly after using them with soap and water or put them in the dishwasher. Clean all "high-touch" surfaces everyday.  Clean and disinfect high-touch surfaces in your "sick room" and bathroom. Let someone else clean and disinfect surfaces in common areas, but not your bedroom and bathroom.  If a caregiver or other person needs to clean and disinfect a sick person's bedroom or bathroom, they should do so on an as-needed basis. The caregiver/other person should wear a mask and wait as long as possible after the sick person has used the bathroom. High-touch surfaces include phones, remote controls, counters, tabletops, doorknobs, bathroom fixtures, toilets, keyboards, tablets, and bedside tables.  Clean and disinfect areas that may have blood, stool, or body fluids on them.  Use household cleaners and disinfectants. Clean the area or item with soap and water or another detergent if it is dirty. Then use a household disinfectant. ? Be sure to follow the instructions on the label to ensure safe and effective use of the product. Many products recommend keeping the surface wet for several minutes to ensure germs are killed. Many also recommend precautions such as wearing gloves and making sure you have good ventilation during use of the  product. ? Most EPA-registered household disinfectants should be effective. When you can be around others after you had or likely had COVID-19 When you can be around others (end home isolation) depends on different factors for different situations.  I think or know I had COVID-19, and I had symptoms ? You can be with others after  24 hours with no fever AND  Symptoms improved AND  10 days since symptoms first appeared ? Depending on your healthcare provider's advice and availability of testing, you might get tested to see if you still have COVID-19. If you will be tested, you can be around others when you have no fever, symptoms have improved, and you receive two negative test results in a row, at least 24 hours apart.  I tested positive for COVID-19 but had no symptoms ? If you continue to have no symptoms, you can be with others after:  10 days have passed since test ? Depending on your healthcare provider's advice and availability of testing, you might get tested to see if you still have COVID-19. If you will be tested, you can be around others after you receive two negative test results in a row, at least 24 hours apart. ? If you develop symptoms after testing positive, follow the guidance above for "I think or know I had COVID, and I had symptoms." [email or web address].[employer] 09/16/2018 This information is not intended to replace advice given to you by your health care provider. Make sure you discuss any questions you have with your health care provider. Document Revised: 10/02/2018 Document Reviewed: 08/05/2018 Elsevier Patient Education  2020 [employer].

## 2019-06-09 NOTE — Progress Notes (Signed)
Established Patient Office Visit  Subjective:  Patient ID: Corey [name], male    DOB: 10-23-1975  Age: 44 y.o. MRN: [id number]  CC:  Chief Complaint  Patient presents with  . Fever    c/o fever, unable to smell and taste, some cough with sore throat. Patient states that wife tested positive for COVID last week.     HPI Corey [name] presents for evaluation of a 3 to 4-day history of elevated temperature, headache, postnasal drip, sore throat, myalgias arthralgias, cough, loss of taste and smell.  Mild dyspnea with exertion.  Denies chest tightness chest pain or wheezing.  Wife diagnosed with Covid 1 week prior prior.  Their  78-year-old son has a runny nose and is otherwise doing well.  Past Medical History:  Diagnosis Date  . Hypertension   . Kidney stones     Past Surgical History:  Procedure Laterality Date  . COLONOSCOPY  05/22/2019  . ESOPHAGOGASTRODUODENOSCOPY     over 25 years ago. Had a stomach ulcer and possibly dx of barretts esophagus  . KNEE SURGERY Right    x2  . LITHOTRIPSY     x2-3   . UPPER GASTROINTESTINAL ENDOSCOPY  05/22/2019    Family History  Problem Relation Age of Onset  . Arthritis Mother   . Hypertension Mother   . Hypertension Father   . Kidney cancer Father        died 6   . Asthma Sister   . Asthma Sister   . Colon cancer Paternal Aunt        died 65  . Esophageal cancer Neg Hx   . Colon polyps Neg Hx   . Rectal cancer Neg Hx   . Stomach cancer Neg Hx     Social History   Socioeconomic History  . Marital status: Married    Spouse name: Not on file  . Number of children: Not on file  . Years of education: Not on file  . Highest education level: Not on file  Occupational History  . Not on file  Tobacco Use  . Smoking status: Never Smoker  . Smokeless tobacco: Never Used  Substance and Sexual Activity  . Alcohol use: No  . Drug use: No  . Sexual activity: Not on file  Other Topics Concern  . Not on file  Social History  Narrative  . Not on file   Social Determinants of Health   Financial Resource Strain:   . Difficulty of Paying Living Expenses:   Food Insecurity:   . Worried About Programme researcher, broadcasting/film/video in the Last Year:   . Barista in the Last Year:   Transportation Needs:   . Freight forwarder (Medical):   Marland Kitchen Lack of Transportation (Non-Medical):   Physical Activity:   . Days of Exercise per Week:   . Minutes of Exercise per Session:   Stress:   . Feeling of Stress :   Social Connections:   . Frequency of Communication with Friends and Family:   . Frequency of Social Gatherings with Friends and Family:   . Attends Religious Services:   . Active Member of Clubs or Organizations:   . Attends Banker Meetings:   Marland Kitchen Marital Status:   Intimate Partner Violence:   . Fear of Current or Ex-Partner:   . Emotionally Abused:   Marland Kitchen Physically Abused:   . Sexually Abused:     Outpatient Medications Prior to Visit  Medication Sig  Dispense Refill  . irbesartan (AVAPRO) 300 MG tablet Take 1qd (Plz schedule appt for future fills) 90 tablet 0  . pantoprazole (PROTONIX) 40 MG tablet Take 40 mg by mouth twice a day for 8 weeks,then reduce to daily 120 tablet 1  . sildenafil (REVATIO) 20 MG tablet May take 3-5 tablets daily as needed  45 minutes prior (Patient not taking: Reported on 06/09/2019) 50 tablet 2   No facility-administered medications prior to visit.    Allergies  Allergen Reactions  . Hydrochlorothiazide Other (See Comments)    Sun Sensitivity Sweating   . Ace Inhibitors Other (See Comments)    Other reaction(s): Cough (ALLERGY/intolerance)   . Morphine And Related Hives and Itching    ROS Review of Systems  Constitutional: Positive for fatigue. Negative for chills, diaphoresis, fever and unexpected weight change.  HENT: Positive for congestion, postnasal drip and sore throat.   Eyes: Negative for photophobia and visual disturbance.  Respiratory: Positive for  cough and shortness of breath. Negative for chest tightness and wheezing.   Cardiovascular: Negative.   Gastrointestinal: Positive for nausea. Negative for vomiting.  Genitourinary: Negative.   Musculoskeletal: Positive for arthralgias and myalgias.  Neurological: Positive for headaches.  Psychiatric/Behavioral: Negative.       Objective:    Physical Exam  Constitutional: He is oriented to person, place, and time. He appears well-developed and well-nourished. No distress.  HENT:  Head: Normocephalic and atraumatic.  Right Ear: External ear normal.  Left Ear: External ear normal.  Eyes: Right eye exhibits no discharge. Left eye exhibits no discharge. No scleral icterus.  Neck: No tracheal deviation present.  Pulmonary/Chest: Effort normal. No stridor.  Neurological: He is alert and oriented to person, place, and time.  Psychiatric: He has a normal mood and affect. His behavior is normal.    Temp 99.9 F (37.7 C) (Tympanic) Comment: per pt  Ht 6' (1.829 m)   Wt 260 lb (117.9 kg) Comment: per pt  BMI 35.26 kg/m  Wt Readings from Last 3 Encounters:  06/09/19 260 lb (117.9 kg)  05/22/19 263 lb (119.3 kg)  04/28/19 263 lb 2 oz (119.4 kg)     Health Maintenance Due  Topic Date Due  . COVID-19 Vaccine (1) Never done    There are no preventive care reminders to display for this patient.  No results found for: TSH Lab Results  Component Value Date   WBC 11.5 (H) 04/24/2019   HGB 16.9 04/24/2019   HCT 49.6 04/24/2019   MCV 86.1 04/24/2019   PLT 254.0 04/24/2019   Lab Results  Component Value Date   NA 137 04/24/2019   K 4.3 04/24/2019   CO2 27 04/24/2019   GLUCOSE 98 04/24/2019   BUN 16 04/24/2019   CREATININE 1.04 04/24/2019   BILITOT 0.9 04/24/2019   ALKPHOS 74 04/24/2019   AST 20 04/24/2019   ALT 27 04/24/2019   PROT 6.9 04/24/2019   ALBUMIN 4.5 04/24/2019   CALCIUM 9.6 04/24/2019   GFR 76.89 04/24/2019   Lab Results  Component Value Date   CHOL 209  (H) 09/11/2017   Lab Results  Component Value Date   HDL 40.40 09/11/2017   Lab Results  Component Value Date   LDLCALC 133 (H) 09/11/2017   Lab Results  Component Value Date   TRIG 180.0 (H) 09/11/2017   Lab Results  Component Value Date   CHOLHDL 5 09/11/2017   No results found for: HGBA1C    Assessment & Plan:  Problem List Items Addressed This Visit      Other   COVID-19 - Primary      No orders of the defined types were placed in this encounter.   Follow-up: Return in about 1 week (around 06/16/2019), or if symptoms worsen or fail to improve.  Will quarantine for 2 weeks from start of symptoms which will be week from this coming Thursday for patient.  Emergency evaluation for increasing shortness of breath and difficulty breathing with fever, otherwise we will treat symptomatically with Tylenol etc.  Vaccine advised when out of quarantine.  Libby Maw, MD   Virtual Visit via Video Note  I connected with [facility] on 06/09/19 at  2:00 PM EDT by a video enabled telemedicine application and verified that I am speaking with the correct person using two identifiers.  Location: Patient: at home with wife and child. Provider:    I discussed the limitations of evaluation and management by telemedicine and the availability of in person appointments. The patient expressed understanding and agreed to proceed.  History of Present Illness:    Observations/Objective:   Assessment and Plan:   Follow Up Instructions:    I discussed the assessment and treatment plan with the patient. The patient was provided an opportunity to ask questions and all were answered. The patient agreed with the plan and demonstrated an understanding of the instructions.   The patient was advised to call back or seek an in-person evaluation if the symptoms worsen or if the condition fails to improve as anticipated.  I provided 20 minutes of non-face-to-face time during this  encounter.   Libby Maw, MD

## 2019-07-07 ENCOUNTER — Ambulatory Visit (AMBULATORY_SURGERY_CENTER): Payer: Self-pay | Admitting: *Deleted

## 2019-07-07 ENCOUNTER — Other Ambulatory Visit: Payer: Self-pay

## 2019-07-07 VITALS — Ht 72.0 in | Wt 263.0 lb

## 2019-07-07 DIAGNOSIS — K2970 Gastritis, unspecified, without bleeding: Secondary | ICD-10-CM

## 2019-07-07 DIAGNOSIS — K2990 Gastroduodenitis, unspecified, without bleeding: Secondary | ICD-10-CM

## 2019-07-07 NOTE — Progress Notes (Signed)
Patient is here in-person for PV. Patient denies any allergies to eggs or soy. Patient denies any problems with anesthesia/sedation. Patient denies any oxygen use at home. Patient denies taking any diet/weight loss medications or blood thinners. Patient is not being treated for MRSA or C-diff. Patient denies any medical or changes in history since last GI visit. Patient is aware of our care-partner policy and Covid-19 safety protocol. No covid test due to pt had covid on May 4,21.

## 2019-07-17 ENCOUNTER — Encounter: Payer: Self-pay | Admitting: Gastroenterology

## 2019-07-20 ENCOUNTER — Other Ambulatory Visit: Payer: Self-pay | Admitting: Family Medicine

## 2019-07-20 DIAGNOSIS — I10 Essential (primary) hypertension: Secondary | ICD-10-CM

## 2019-07-28 ENCOUNTER — Ambulatory Visit (AMBULATORY_SURGERY_CENTER): Payer: 59 | Admitting: Gastroenterology

## 2019-07-28 ENCOUNTER — Other Ambulatory Visit: Payer: Self-pay

## 2019-07-28 ENCOUNTER — Encounter: Payer: Self-pay | Admitting: Gastroenterology

## 2019-07-28 VITALS — BP 152/100 | HR 69 | Temp 97.3°F | Resp 19 | Ht 72.0 in | Wt 263.0 lb

## 2019-07-28 DIAGNOSIS — K2970 Gastritis, unspecified, without bleeding: Secondary | ICD-10-CM

## 2019-07-28 DIAGNOSIS — K219 Gastro-esophageal reflux disease without esophagitis: Secondary | ICD-10-CM

## 2019-07-28 DIAGNOSIS — K2951 Unspecified chronic gastritis with bleeding: Secondary | ICD-10-CM | POA: Diagnosis not present

## 2019-07-28 DIAGNOSIS — K2100 Gastro-esophageal reflux disease with esophagitis, without bleeding: Secondary | ICD-10-CM

## 2019-07-28 DIAGNOSIS — K200 Eosinophilic esophagitis: Secondary | ICD-10-CM

## 2019-07-28 DIAGNOSIS — Z8719 Personal history of other diseases of the digestive system: Secondary | ICD-10-CM

## 2019-07-28 MED ORDER — SODIUM CHLORIDE 0.9 % IV SOLN: 500.0000 mL | Freq: Once | INTRAVENOUS | Status: DC

## 2019-07-28 MED ORDER — PANTOPRAZOLE SODIUM 20 MG PO TBEC
20.0000 mg | DELAYED_RELEASE_TABLET | Freq: Two times a day (BID) | ORAL | 3 refills | Status: DC
Start: 2019-07-28 — End: 2020-01-06

## 2019-07-28 NOTE — Progress Notes (Signed)
Lidocaine 100mg  IV given to reduce gag reflesx

## 2019-07-28 NOTE — Op Note (Signed)
[facility] Patient Name: Corey [name] Procedure Date: 07/28/2019 10:03 AM MRN: [id number] Endoscopist: Doristine Locks , MD Age: 44 Referring MD:  Date of Birth: 09-20-1975 Gender: Male Account #: [id number] Procedure:                Upper GI endoscopy Indications:              Heartburn, Follow-up of reflux esophagitis,                            Follow-up of eosinophilic esophagitis, Follow-up of                            gastritis                           EGD on 05/22/19 with LA Grade A esophagitis, non-H                            pylori gastritis, and felinization/longitudinal                            furrows, with elevated esophageal eosinophils (40                            distal, 20 proximal), consistent with EoE with                            reflux overlap. He was treated with Protonix 40 mg                            PO BID x8 weeks and made some antireflux lifestyle                            modifications (elevated HOB) with clinical                            improvement, and presents today to evaluate for                            endoscopic/histologic response to therapy. Medicines:                Monitored Anesthesia Care Procedure:                Pre-Anesthesia Assessment:                           - Prior to the procedure, a History and Physical                            was performed, and patient medications and                            allergies were reviewed. The patient's tolerance of  previous anesthesia was also reviewed. The risks                            and benefits of the procedure and the sedation                            options and risks were discussed with the patient.                            All questions were answered, and informed consent                            was obtained. Prior Anticoagulants: The patient has                            taken no previous anticoagulant or antiplatelet                             agents. ASA Grade Assessment: II - A patient with                            mild systemic disease. After reviewing the risks                            and benefits, the patient was deemed in                            satisfactory condition to undergo the procedure.                           After obtaining informed consent, the endoscope was                            passed under direct vision. Throughout the                            procedure, the patient's blood pressure, pulse, and                            oxygen saturations were monitored continuously. The                            Endoscope was introduced through the mouth, and                            advanced to the second part of duodenum. The upper                            GI endoscopy was accomplished without difficulty.                            The patient tolerated the procedure well. Scope In: Scope Out: Findings:  Mucosal changes including very subtle longitudinal                            furrows were found in the upper third of the                            esophagus and in the middle third of the esophagus.                            This overall appeared much improved from the prior                            study. Biopsies were obtained from the proximal and                            distal esophagus with cold forceps for histology of                            suspected eosinophilic esophagitis. Estimated blood                            loss was minimal.                           The Z-line was regular and was found 41 cm from the                            incisors. The previously noted reflux esophagitis                            has since healed.                           The entire examined stomach was normal. The                            previously noted gastritis has since healed.                           The duodenal bulb, first portion of the duodenum                             and second portion of the duodenum were normal. Complications:            No immediate complications. Estimated Blood Loss:     Estimated blood loss was minimal. Impression:               - Esophageal mucosal changes. Biopsied.                           - Z-line regular, 41 cm from the incisors.                           - Normal stomach.                           -  Normal duodenal bulb, first portion of the                            duodenum and second portion of the duodenum. Recommendation:           - Patient has a contact number available for                            emergencies. The signs and symptoms of potential                            delayed complications were discussed with the                            patient. Return to normal activities tomorrow.                            Written discharge instructions were provided to the                            patient.                           - Resume previous diet.                           - Continue present medications.                           - Await pathology results.                           - Return to GI clinic in 2-3 months, or sooner as                            needed.                           - Can reduce Protonix to 20 mg PO twice daily. Doristine Locks, MD 07/28/2019 10:19:08 AM

## 2019-07-28 NOTE — Progress Notes (Signed)
Pt Drowsy. VSS. To PACU, report to RN. No anesthetic complications noted.  

## 2019-07-28 NOTE — Progress Notes (Signed)
Called to room to assist during endoscopic procedure.  Patient ID and intended procedure confirmed with present staff. Received instructions for my participation in the procedure from the performing physician.  

## 2019-07-28 NOTE — Progress Notes (Signed)
VS-CW  Pt's states no medical or surgical changes since previsit or office visit.  

## 2019-07-28 NOTE — Patient Instructions (Signed)
Impression/Recommendations:  Resume previous diet. Continue present medications. Await pathology results.  Return to GI clinic in 2-3 months, or sooner as needed.  Reduce Protonix to 20 mg. By mouth 2 times daily.  YOU HAD AN ENDOSCOPIC PROCEDURE TODAY AT THE [facility]:   Refer to the procedure report that was given to you for any specific questions about what was found during the examination.  If the procedure report does not answer your questions, please call your gastroenterologist to clarify.  If you requested that your care partner not be given the details of your procedure findings, then the procedure report has been included in a sealed envelope for you to review at your convenience later.  YOU SHOULD EXPECT: Some feelings of bloating in the abdomen. Passage of more gas than usual.  Walking can help get rid of the air that was put into your GI tract during the procedure and reduce the bloating. If you had a lower endoscopy (such as a colonoscopy or flexible sigmoidoscopy) you may notice spotting of blood in your stool or on the toilet paper. If you underwent a bowel prep for your procedure, you may not have a normal bowel movement for a few days.  Please Note:  You might notice some irritation and congestion in your nose or some drainage.  This is from the oxygen used during your procedure.  There is no need for concern and it should clear up in a day or so.  SYMPTOMS TO REPORT IMMEDIATELY:    Following upper endoscopy (EGD)  Vomiting of blood or coffee ground material  New chest pain or pain under the shoulder blades  Painful or persistently difficult swallowing  New shortness of breath  Fever of 100F or higher  Black, tarry-looking stools  For urgent or emergent issues, a gastroenterologist can be reached at any hour by calling [phone]. Do not use MyChart messaging for urgent concerns.    DIET:  We do recommend a small meal at first, but then you may  proceed to your regular diet.  Drink plenty of fluids but you should avoid alcoholic beverages for 24 hours.  ACTIVITY:  You should plan to take it easy for the rest of today and you should NOT DRIVE or use heavy machinery until tomorrow (because of the sedation medicines used during the test).    FOLLOW UP: Our staff will call the number listed on your records 48-72 hours following your procedure to check on you and address any questions or concerns that you may have regarding the information given to you following your procedure. If we do not reach you, we will leave a message.  We will attempt to reach you two times.  During this call, we will ask if you have developed any symptoms of COVID 19. If you develop any symptoms (ie: fever, flu-like symptoms, shortness of breath, cough etc.) before then, please call [phone].  If you test positive for Covid 19 in the 2 weeks post procedure, please call and report this information to [place].    If any biopsies were taken you will be contacted by phone or by letter within the next 1-3 weeks.  Please call us at [phone] if you have not heard about the biopsies in 3 weeks.    SIGNATURES/CONFIDENTIALITY: You and/or your care partner have signed paperwork which will be entered into your electronic medical record.  These signatures attest to the fact that that the information above on your After Visit Summary  has been reviewed and is understood.  Full responsibility of the confidentiality of this discharge information lies with you and/or your care-partner.

## 2019-07-28 NOTE — Progress Notes (Signed)
Upon inquiry, pt. Reports he took his BP med last night, and it usually runs high when he is at the Dr.  [employer] pt.to let his PCP know his BP was running high, to see if a medication change might be indicated.  Pt. Reported he felt just fine.   Skin warm and dry, color normal.

## 2019-07-30 ENCOUNTER — Telehealth: Payer: Self-pay | Admitting: *Deleted

## 2019-07-30 NOTE — Telephone Encounter (Signed)
  Follow up Call-  Call back number 07/28/2019 05/22/2019  Post procedure Call Back phone  # [phone]  Permission to leave phone message Yes Yes  Some recent data might be hidden     Patient questions:  Do you have a fever, pain , or abdominal swelling? No. Pain Score  0 *  Have you tolerated food without any problems? Yes.    Have you been able to return to your normal activities? Yes.    Do you have any questions about your discharge instructions: Diet   No. Medications  No. Follow up visit  No.  Do you have questions or concerns about your Care? No.  Actions: * If pain score is 4 or above: No action needed, pain <4  1. Have you developed a fever since your procedure? NO  2.   Have you had an respiratory symptoms (SOB or cough) since your procedure? NO  3.   Have you tested positive for COVID 19 since your procedure NO  4.   Have you had any family members/close contacts diagnosed with the COVID 19 since your procedure?  NO   If yes to any of these questions please route to Laverna Peace, RN and Charlett Lango, RN

## 2019-08-14 ENCOUNTER — Encounter: Payer: Self-pay | Admitting: Gastroenterology

## 2019-08-19 ENCOUNTER — Other Ambulatory Visit: Payer: Self-pay | Admitting: Family Medicine

## 2019-08-19 DIAGNOSIS — I10 Essential (primary) hypertension: Secondary | ICD-10-CM

## 2019-09-29 IMAGING — CT CT HEAD WO/W CM
1 of 2 series · 13 of 30 positions shown, 17 images · IV contrast (agent unspecified)
Comparison: None.

CLINICAL DATA: 42 y/o M; chronic migraines worse over the past
month. Migraines every night with blurry vision, occasional
dizziness.

EXAM:
CT HEAD WITHOUT AND WITH CONTRAST
TECHNIQUE: Contiguous axial images were obtained from the base of the skull
through the vertex without and with intravenous contrast
CONTRAST:  75 cc Zsovue-HVV

[Series 2: head w/(date) · axial · 0.46mm/px · z∈[-179,-29]mm · 13 of 36 slices shown, 17 images]
[im 3/36  brain]
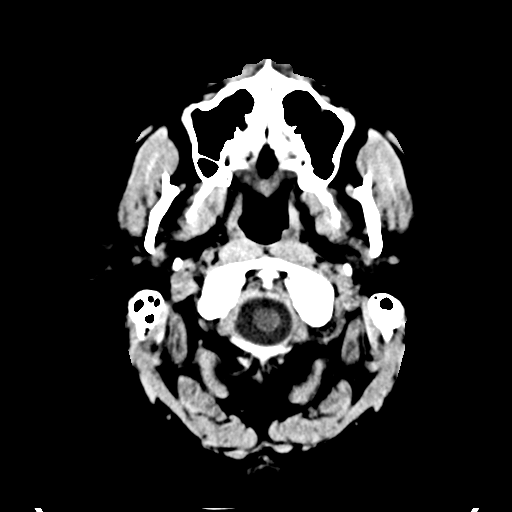
[im 3/36  bone]
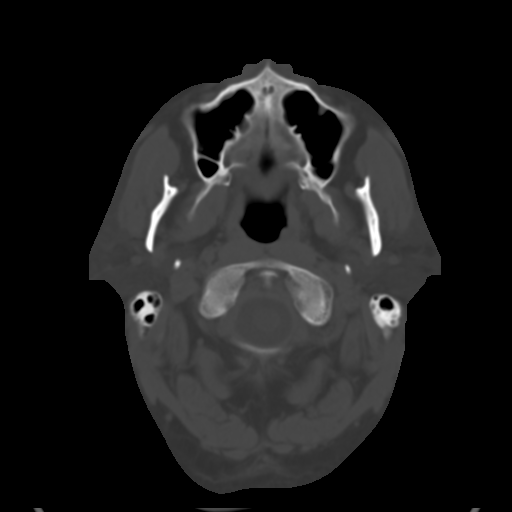
[im 6/36  brain]
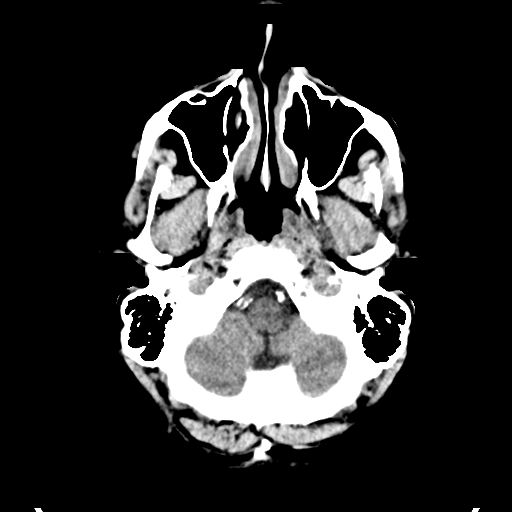
[im 8/36  brain]
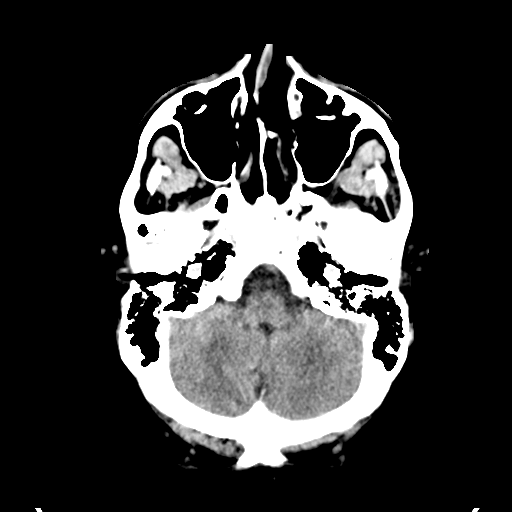
[im 11/36  brain]
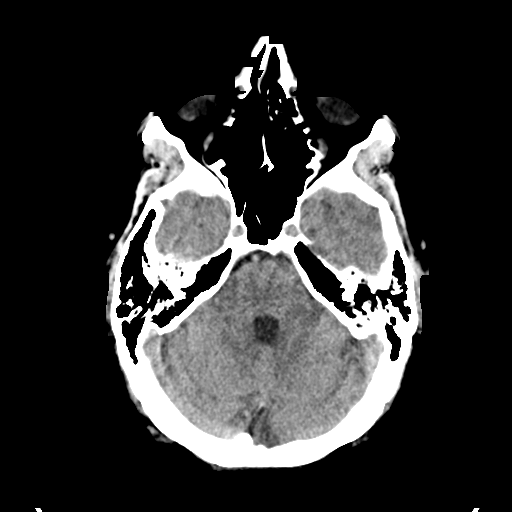
[im 13/36  brain]
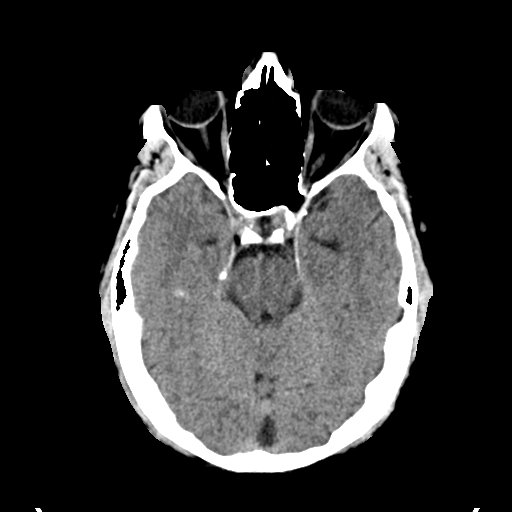
[im 13/36  bone]
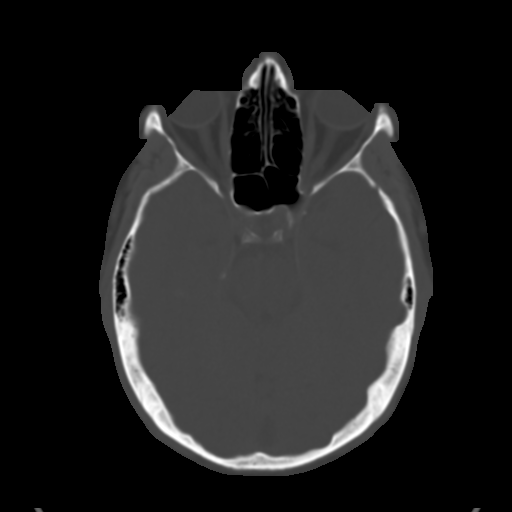
[im 16/36  brain]
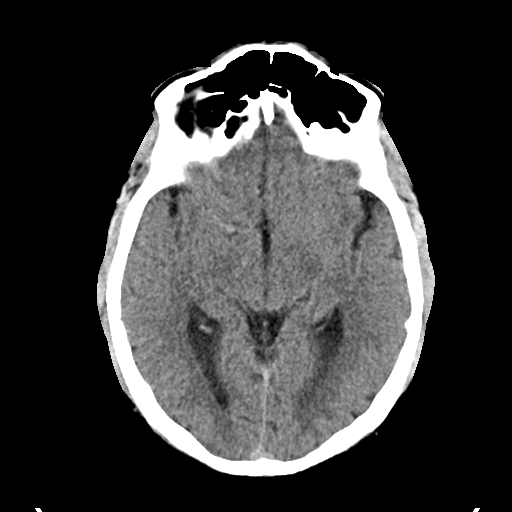
[im 18/36  brain]
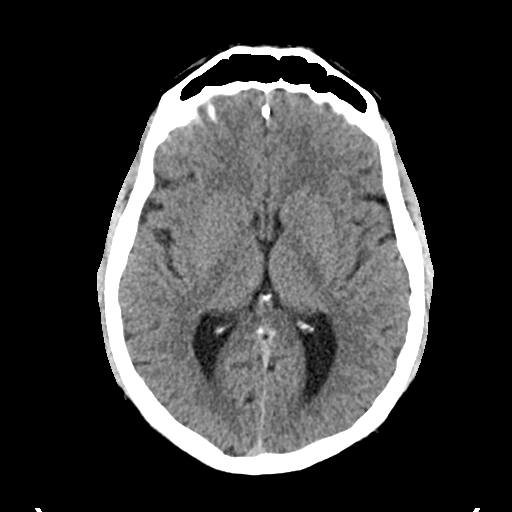
[im 21/36  brain]
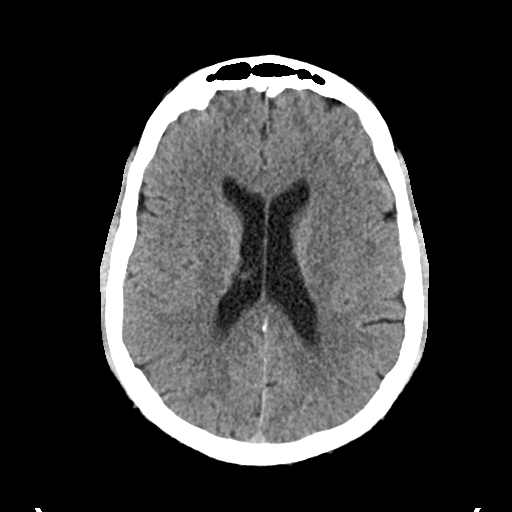
[im 23/36  brain]
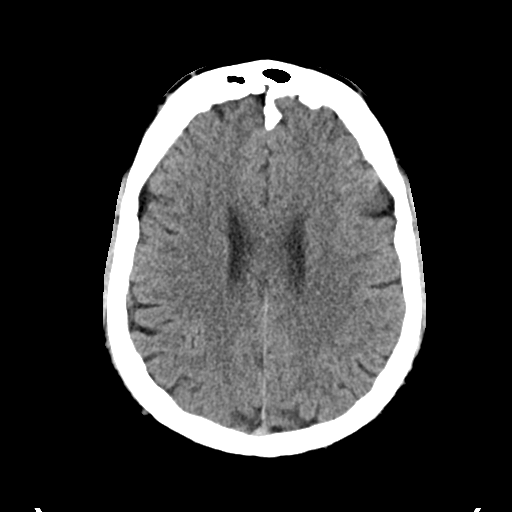
[im 23/36  bone]
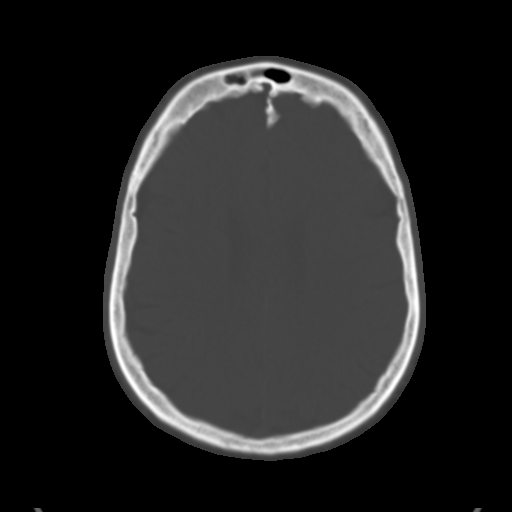
[im 26/36  brain]
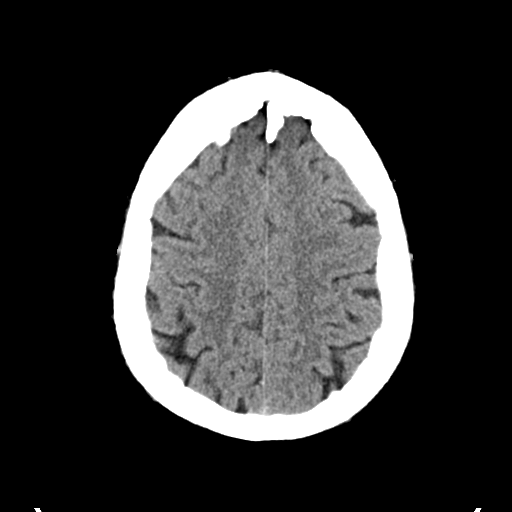
[im 28/36  brain]
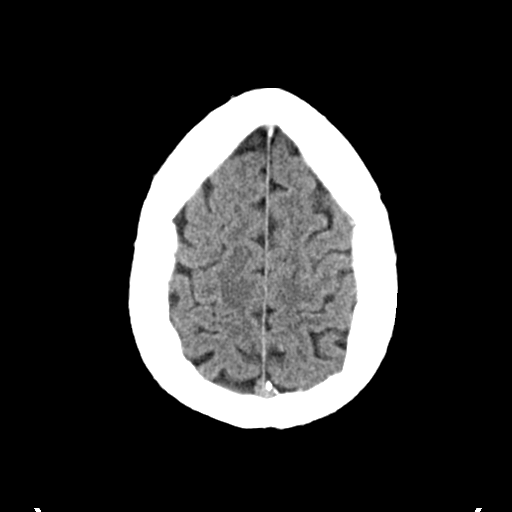
[im 31/36  brain]
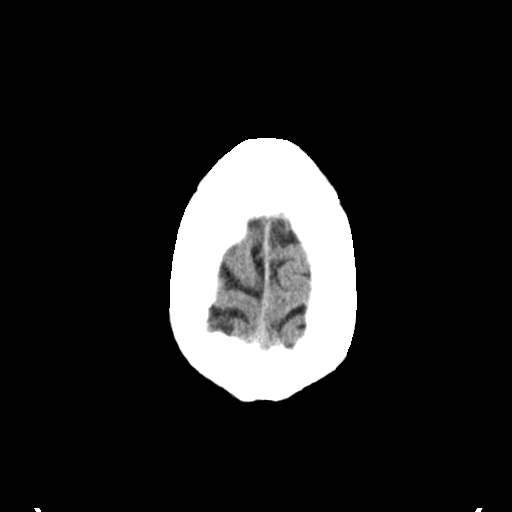
[im 33/36  brain]
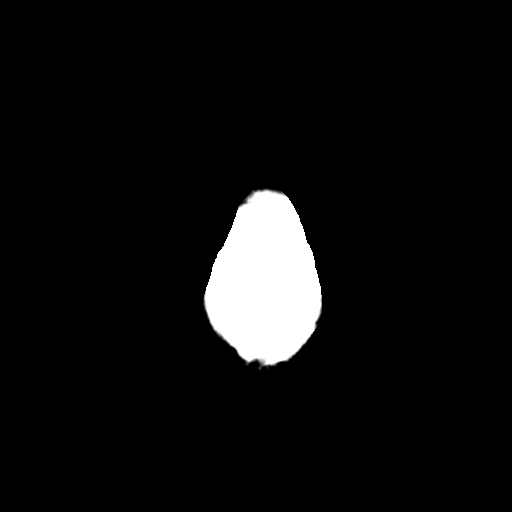
[im 33/36  bone]
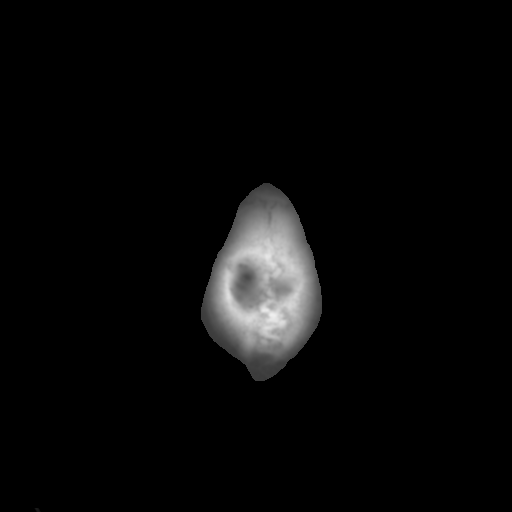

[13 of 30 positions shown; findings below may reference images not displayed]

FINDINGS: Brain: No evidence of acute infarction, hemorrhage, hydrocephalus,
extra-axial collection or mass lesion/mass effect. After
administration of intravenous contrast there is NO abnormal
enhancement of the brain.

Vascular: No hyperdense vessel or unexpected calcification. Visible
vessels are patent.

Skull: Normal. Negative for fracture or focal lesion.

Sinuses/Orbits: No acute finding.

Other: None.
IMPRESSION: No acute intracranial process identified. Unremarkable CT of the
head.

By: Paulus N Ceejay M.D.

## 2019-12-27 ENCOUNTER — Other Ambulatory Visit: Payer: Self-pay | Admitting: Family Medicine

## 2019-12-27 DIAGNOSIS — I10 Essential (primary) hypertension: Secondary | ICD-10-CM

## 2019-12-28 DIAGNOSIS — S92422A Displaced fracture of distal phalanx of left great toe, initial encounter for closed fracture: Secondary | ICD-10-CM

## 2019-12-28 HISTORY — DX: Displaced fracture of distal phalanx of left great toe, initial encounter for closed fracture: S92.422A

## 2019-12-28 NOTE — Telephone Encounter (Signed)
Pt wife called in stating pt is completely out of medication. BP was running high yesterday 197/100. This was during an urgent care visit for his foot yesterday.   Call back # [phone]

## 2020-01-06 ENCOUNTER — Encounter: Payer: Self-pay | Admitting: Dermatology

## 2020-01-06 ENCOUNTER — Other Ambulatory Visit: Payer: Self-pay

## 2020-01-06 ENCOUNTER — Ambulatory Visit: Payer: 59 | Admitting: Dermatology

## 2020-01-06 DIAGNOSIS — L570 Actinic keratosis: Secondary | ICD-10-CM

## 2020-01-06 DIAGNOSIS — Z1283 Encounter for screening for malignant neoplasm of skin: Secondary | ICD-10-CM

## 2020-01-10 ENCOUNTER — Encounter: Payer: Self-pay | Admitting: Dermatology

## 2020-01-10 NOTE — Progress Notes (Signed)
   Follow-Up Visit   Subjective  Corey [name] is a 44 y.o. male who presents for the following: Skin Problem (scaly spots on face per Michaele Offer).  Crusts Location: Face Duration: Several years Quality: Slowly increasing number Associated Signs/Symptoms: Modifying Factors: Previous medicated with therapy did help (this medication is no longer available in the [place]). Severity:  Timing: Context:   Objective  Well appearing patient in no apparent distress; mood and affect are within normal limits.  All skin waist up examined.   Assessment & Plan    Screening exam for skin cancer Chest - [facility])  He is encouraged to self examine his skin twice annually, dermatologist examination once yearly.  AK (actinic keratosis) Head - Anterior (Face)  Per Dr Jorja Loa 28 total days tolak 40g qhs start in Jan. (face).  If too irritated with daily use, take 1 week off and restart applying it every other night, with the same goal of a total of 28 applications.     I, Janalyn Harder, MD, have reviewed all documentation for this visit.  The documentation on 01/10/20 for the exam, diagnosis, procedures, and orders are all accurate and complete.

## 2020-01-23 ENCOUNTER — Other Ambulatory Visit: Payer: Self-pay | Admitting: Family

## 2020-01-23 DIAGNOSIS — I10 Essential (primary) hypertension: Secondary | ICD-10-CM

## 2020-01-27 ENCOUNTER — Encounter: Payer: Self-pay | Admitting: Family

## 2020-01-27 ENCOUNTER — Telehealth (INDEPENDENT_AMBULATORY_CARE_PROVIDER_SITE_OTHER): Payer: 59 | Admitting: Family

## 2020-01-27 VITALS — Temp 98.7°F | Ht 71.0 in | Wt 261.0 lb

## 2020-01-27 DIAGNOSIS — J019 Acute sinusitis, unspecified: Secondary | ICD-10-CM | POA: Diagnosis not present

## 2020-01-27 DIAGNOSIS — B9689 Other specified bacterial agents as the cause of diseases classified elsewhere: Secondary | ICD-10-CM

## 2020-01-27 DIAGNOSIS — G43001 Migraine without aura, not intractable, with status migrainosus: Secondary | ICD-10-CM | POA: Diagnosis not present

## 2020-01-27 MED ORDER — AMOXICILLIN 500 MG PO CAPS: 500.0000 mg | ORAL_CAPSULE | Freq: Two times a day (BID) | ORAL | 0 refills | Status: DC

## 2020-01-27 MED ORDER — PROMETHAZINE-DM 6.25-15 MG/5ML PO SYRP: 5.0000 mL | ORAL_SOLUTION | Freq: Four times a day (QID) | ORAL | 0 refills | Status: DC | PRN

## 2020-01-27 MED ORDER — PREDNISONE 20 MG PO TABS: 40.0000 mg | ORAL_TABLET | Freq: Every day | ORAL | 0 refills | Status: DC

## 2020-01-27 NOTE — Progress Notes (Signed)
Virtual Visit via Video   I connected with patient on 01/27/20 at  3:40 PM EST by a video enabled telemedicine application and verified that I am speaking with the correct person using two identifiers.  Location patient: Home Location provider: [employer], Office Persons participating in the virtual visit: Patient, Provider, CMA  I discussed the limitations of evaluation and management by telemedicine and the availability of in person appointments. The patient expressed understanding and agreed to proceed.  Subjective:   HPI:   Patient is in today for productive cough with yellow phelgm, congestion, sinus pressure and pain x 3 weeks and worsening. Has been taking OTC meds w/o much relief. Has been tested for covid 4 times and all test have been negative. No sick contacts  ROS:   See pertinent positives and negatives per HPI.  Patient Active Problem List   Diagnosis Date Noted  . COVID-19 06/09/2019  . Acute prostatitis 04/24/2019  . Erectile dysfunction 04/24/2019  . Heme positive stool 04/24/2019  . Benign prostatic hyperplasia with weak urinary stream 04/24/2019  . Cauda equina injury without bone injury (HCC) 11/20/2018  . New daily persistent headache 10/09/2017  . Acute non-recurrent maxillary sinusitis 09/11/2017  . Encounter for health maintenance examination with abnormal findings 09/11/2017  . Medication side effect 08/13/2017  . Actinic keratosis due to exposure to sunlight 08/13/2017  . Telangiectasia 08/13/2017  . Essential hypertension 04/08/2017    Social History   Tobacco Use  . Smoking status: Never Smoker  . Smokeless tobacco: Never Used  Substance Use Topics  . Alcohol use: No    Current Outpatient Medications:  .  irbesartan (AVAPRO) 150 MG tablet, Take 150 mg by mouth daily., Disp: , Rfl:  .  irbesartan (AVAPRO) 300 MG tablet, Take 1 tablet by mouth once daily, Disp: 30 tablet, Rfl: 0 .  pantoprazole (PROTONIX) 40 MG tablet, Take  40 mg by mouth daily., Disp: , Rfl:  .  amoxicillin (AMOXIL) 500 MG capsule, Take 1 capsule (500 mg total) by mouth 2 (two) times daily., Disp: 14 capsule, Rfl: 0 .  predniSONE (DELTASONE) 20 MG tablet, Take 2 tablets (40 mg total) by mouth daily with breakfast., Disp: 10 tablet, Rfl: 0 .  promethazine-dextromethorphan (PROMETHAZINE-DM) 6.25-15 MG/5ML syrup, Take 5 mLs by mouth 4 (four) times daily as needed for cough., Disp: 118 mL, Rfl: 0  Allergies  Allergen Reactions  . Hydrochlorothiazide Other (See Comments)    Sun Sensitivity Sweating   . Ace Inhibitors Other (See Comments)    Other reaction(s): Cough (ALLERGY/intolerance)   . Morphine And Related Hives and Itching    Objective:   Temp 98.7 F (37.1 C) (Oral)   Ht 5\' 11"  (1.803 m)   Wt 261 lb (118.4 kg)   BMI 36.40 kg/m   Patient is well-developed, well-nourished in no acute distress.  Resting comfortably at home. Coughing Head is normocephalic, atraumatic.  No labored breathing.  Speech is clear and coherent with logical content.  Patient is alert and oriented at baseline.    Assessment and Plan:    Corey [name] was seen today for acute visit.  Diagnoses and all orders for this visit:  Acute bacterial sinusitis  Migraine without aura and with status migrainosus, not intractable  Other orders -     promethazine-dextromethorphan (PROMETHAZINE-DM) 6.25-15 MG/5ML syrup; Take 5 mLs by mouth 4 (four) times daily as needed for cough. -     amoxicillin (AMOXIL) 500 MG capsule; Take 1 capsule (500 mg total)  by mouth 2 (two) times daily. -     predniSONE (DELTASONE) 20 MG tablet; Take 2 tablets (40 mg total) by mouth daily with breakfast.   Call the office if symptoms worsen or persist. Recheck as needed ans and as scheduled  Eulis Foster, FNP 01/27/2020

## 2020-02-12 ENCOUNTER — Telehealth: Payer: Self-pay

## 2020-02-12 NOTE — Telephone Encounter (Signed)
Patient's wife calling to inform doctor that the pt is still having migraines after having a VV on 01/27/20 for sinus pressure>  Patient's wife is asking for some Prednisone to help with the issue.  Pt was prescribed it at the time of visit and that it did help but pt only was prescribed #10. I informed her that I would send a message to the doc of the day.  Please advise.  Wife's CB# [phone]

## 2020-02-14 MED ORDER — PREDNISONE 10 MG (21) PO TBPK: ORAL_TABLET | ORAL | 0 refills | Status: DC

## 2020-02-17 ENCOUNTER — Other Ambulatory Visit: Payer: Self-pay

## 2020-02-17 ENCOUNTER — Ambulatory Visit (INDEPENDENT_AMBULATORY_CARE_PROVIDER_SITE_OTHER): Payer: 59 | Admitting: *Deleted

## 2020-02-17 DIAGNOSIS — L570 Actinic keratosis: Secondary | ICD-10-CM | POA: Diagnosis not present

## 2020-02-17 MED ORDER — AMINOLEVULINIC ACID HCL 10 % EX GEL
2000.0000 mg | Freq: Once | CUTANEOUS | Status: AC
  Administered 2020-02-17: 2000 mg via TOPICAL

## 2020-02-17 NOTE — Progress Notes (Signed)
Photodynamic Therapy Procedure Note Diagnosis: Actinic keratosis Location: Face Informed Consent: Discussed risks (burning, pain, redness, peeling, severe sunburn-like reaction, blistering, discoloration, lack of resolution) and benefits of the procedure, as well as the alternatives. Informed consent was obtained. Preparation: After cleansing the skin, the area to be treated was coated with Levulan.  This was allowed to sit on the skin for 1.5 hours. Procedure Details: The patient was placed under the light source with appropriate eye protection for 16 minutes & 42 minutes. After completing the treatment, the patient applied sunscreen to the treated areas. Patient tolerated the procedure well Plan: Avoid any sun exposure for the next 24 hours. Wear sunscreen daily for the next week. Observe normal sun precautions thereafter. Recommend OTC analgesia as needed for pain. Follow-up in 10-12 weeks.

## 2020-02-17 NOTE — Telephone Encounter (Signed)
Patient's wife notified VIA phone to schedule an appt if no better after this round of prednisone.   Dm/cma

## 2020-02-17 NOTE — Patient Instructions (Signed)

## 2020-02-27 ENCOUNTER — Other Ambulatory Visit: Payer: Self-pay | Admitting: Family

## 2020-02-27 DIAGNOSIS — I10 Essential (primary) hypertension: Secondary | ICD-10-CM

## 2020-04-04 ENCOUNTER — Other Ambulatory Visit: Payer: Self-pay | Admitting: Family

## 2020-04-04 DIAGNOSIS — I10 Essential (primary) hypertension: Secondary | ICD-10-CM

## 2020-04-04 NOTE — Telephone Encounter (Signed)
Patients wife Morrie Sheldon is calling to check the status of patients refill. She states that he is completely out (ran out on Saturday). Please call wife [phone]) when this has been sent in.

## 2020-04-07 ENCOUNTER — Other Ambulatory Visit: Payer: Self-pay

## 2020-04-07 DIAGNOSIS — I10 Essential (primary) hypertension: Secondary | ICD-10-CM

## 2020-04-07 MED ORDER — IRBESARTAN 300 MG PO TABS: 300.0000 mg | ORAL_TABLET | Freq: Every day | ORAL | 0 refills | Status: DC

## 2020-04-27 ENCOUNTER — Ambulatory Visit: Payer: 59 | Admitting: Dermatology

## 2020-05-17 ENCOUNTER — Ambulatory Visit: Payer: 59 | Admitting: Dermatology

## 2020-05-23 ENCOUNTER — Ambulatory Visit: Payer: 59 | Admitting: Family Medicine

## 2020-06-11 ENCOUNTER — Other Ambulatory Visit: Payer: Self-pay | Admitting: Family Medicine

## 2020-06-11 DIAGNOSIS — I10 Essential (primary) hypertension: Secondary | ICD-10-CM

## 2020-07-13 ENCOUNTER — Other Ambulatory Visit: Payer: Self-pay | Admitting: Family Medicine

## 2020-07-13 DIAGNOSIS — I10 Essential (primary) hypertension: Secondary | ICD-10-CM

## 2020-08-18 NOTE — Telephone Encounter (Signed)
Patient notified and verbalized understanding. Appointment scheduled. 

## 2020-08-20 ENCOUNTER — Other Ambulatory Visit: Payer: Self-pay | Admitting: Family Medicine

## 2020-08-20 DIAGNOSIS — I10 Essential (primary) hypertension: Secondary | ICD-10-CM

## 2020-09-02 ENCOUNTER — Encounter: Payer: Self-pay | Admitting: Family Medicine

## 2020-09-02 ENCOUNTER — Other Ambulatory Visit: Payer: Self-pay

## 2020-09-02 ENCOUNTER — Ambulatory Visit: Payer: 59 | Admitting: Family Medicine

## 2020-09-02 VITALS — BP 162/100 | HR 83 | Temp 97.0°F | Ht 71.0 in | Wt 270.8 lb

## 2020-09-02 DIAGNOSIS — I10 Essential (primary) hypertension: Secondary | ICD-10-CM

## 2020-09-02 DIAGNOSIS — Z0001 Encounter for general adult medical examination with abnormal findings: Secondary | ICD-10-CM

## 2020-09-02 DIAGNOSIS — Z0000 Encounter for general adult medical examination without abnormal findings: Secondary | ICD-10-CM

## 2020-09-02 LAB — COMPREHENSIVE METABOLIC PANEL
ALT: 32 U/L (ref 0–53)
AST: 19 U/L (ref 0–37)
Albumin: 4.6 g/dL (ref 3.5–5.2)
Alkaline Phosphatase: 88 U/L (ref 39–117)
BUN: 17 mg/dL (ref 6–23)
CO2: 27 mEq/L (ref 19–32)
Calcium: 9.3 mg/dL (ref 8.4–10.5)
Chloride: 104 mEq/L (ref 96–112)
Creatinine, Ser: 1.06 mg/dL (ref 0.40–1.50)
GFR: 83.66 mL/min (ref >60.00)
Glucose, Bld: 102 mg/dL — ABNORMAL HIGH (ref 70–99)
Potassium: 4.4 mEq/L (ref 3.5–5.1)
Sodium: 139 mEq/L (ref 135–145)
Total Bilirubin: 0.7 mg/dL (ref 0.2–1.2)
Total Protein: 6.7 g/dL (ref 6.0–8.3)

## 2020-09-02 LAB — CBC
HCT: 50.9 % (ref 39.0–52.0)
Hemoglobin: 17.1 g/dL — ABNORMAL HIGH (ref 13.0–17.0)
MCHC: 33.7 g/dL (ref 30.0–36.0)
MCV: 86.5 fl (ref 78.0–100.0)
Platelets: 248 10*3/uL (ref 150.0–400.0)
RBC: 5.89 Mil/uL — ABNORMAL HIGH (ref 4.22–5.81)
RDW: 13.8 % (ref 11.5–15.5)
WBC: 8.6 10*3/uL (ref 4.0–10.5)

## 2020-09-02 LAB — URINALYSIS, ROUTINE W REFLEX MICROSCOPIC
Bilirubin Urine: NEGATIVE
Hgb urine dipstick: NEGATIVE
Ketones, ur: NEGATIVE
Leukocytes,Ua: NEGATIVE
Nitrite: NEGATIVE
RBC / HPF: NONE SEEN (ref 0–?)
Specific Gravity, Urine: 1.02 (ref 1.000–1.030)
Total Protein, Urine: NEGATIVE
Urine Glucose: NEGATIVE
Urobilinogen, UA: 0.2 (ref 0.0–1.0)
pH: 5.5 (ref 5.0–8.0)

## 2020-09-02 LAB — PSA: PSA: 0.9 ng/mL (ref 0.10–4.00)

## 2020-09-02 LAB — LIPID PANEL
Cholesterol: 248 mg/dL — ABNORMAL HIGH (ref 0–200)
HDL: 41.3 mg/dL (ref >39.00)
NonHDL: 206.45
Total CHOL/HDL Ratio: 6
Triglycerides: 231 mg/dL — ABNORMAL HIGH (ref 0.0–149.0)
VLDL: 46.2 mg/dL — ABNORMAL HIGH (ref 0.0–40.0)

## 2020-09-02 LAB — HEMOGLOBIN A1C: Hgb A1c MFr Bld: 5.9 % (ref 4.6–6.5)

## 2020-09-02 LAB — LDL CHOLESTEROL, DIRECT: Direct LDL: 154 mg/dL

## 2020-09-02 MED ORDER — IRBESARTAN 300 MG PO TABS: ORAL_TABLET | ORAL | 0 refills | Status: DC

## 2020-09-02 MED ORDER — AMLODIPINE BESYLATE 5 MG PO TABS: 5.0000 mg | ORAL_TABLET | Freq: Every day | ORAL | 1 refills | Status: DC

## 2020-09-02 NOTE — Progress Notes (Signed)
Established Patient Office Visit  Subjective:  Patient ID: Corey [name], male    DOB: [birth year]  Age: 45 y.o. MRN: [id number]  CC:  Chief Complaint  Patient presents with   Follow-up    Refill/follow up on BP, no concerns.     HPI Corey [name] presents for follow-up of hypertension and a physical exam.  Doing relatively well.  Chart review shows recent EGD and colonoscopy.  EGD negative for Barrett's esophagus.  Uses Prevacid as needed for reflux.  Blood pressure has been elevated.  He is compliant with Avapro.  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  Has difficulty maintaining his hydration.  Has been consuming hydration salts.  Denies headaches, blurred vision or lightheadedness.  Not exercising.  Things are well at home.  Past Medical History:  Diagnosis Date   Hypertension    Kidney stones     Past Surgical History:  Procedure Laterality Date   COLONOSCOPY  05/22/2019   ESOPHAGOGASTRODUODENOSCOPY     over 25 years ago. Had a stomach ulcer and possibly dx of barretts esophagus   KNEE SURGERY Right    x2   LITHOTRIPSY     x2-3    UPPER GASTROINTESTINAL ENDOSCOPY  05/22/2019    Family History  Problem Relation Age of Onset   Arthritis Mother    Hypertension Mother    Hypertension Father    Kidney cancer Father        died 53    Asthma Sister    Asthma Sister    Colon cancer Paternal Aunt        died 23   Esophageal cancer Neg Hx    Colon polyps Neg Hx    Rectal cancer Neg Hx    Stomach cancer Neg Hx     Social History   Socioeconomic History   Marital status: Married    Spouse name: Not on file   Number of children: Not on file   Years of education: Not on file   Highest education level: Not on file  Occupational History   Not on file  Tobacco Use   Smoking status: Never   Smokeless tobacco: Never  Vaping Use   Vaping Use: Never used  Substance and Sexual Activity   Alcohol use: No   Drug use: No   Sexual activity: Not on file   Other Topics Concern   Not on file  Social History Narrative   Not on file   Social Determinants of Health   Financial Resource Strain: Not on file  Food Insecurity: Not on file  Transportation Needs: Not on file  Physical Activity: Not on file  Stress: Not on file  Social Connections: Not on file  Intimate Partner Violence: Not on file    Outpatient Medications Prior to Visit  Medication Sig Dispense Refill   irbesartan (AVAPRO) 300 MG tablet TAKE 1 TABLET BY MOUTH ONCE DAILY**NEEDS TO COME IN FOR FOLLOW UP** 30 tablet 0   pantoprazole (PROTONIX) 40 MG tablet Take 40 mg by mouth daily.     amoxicillin (AMOXIL) 500 MG capsule Take 1 capsule (500 mg total) by mouth 2 (two) times daily. (Patient not taking: Reported on 09/02/2020) 14 capsule 0   predniSONE (STERAPRED UNI-PAK 21 TAB) 10 MG (21) TBPK tablet As directed 21 tablet 0   promethazine-dextromethorphan (PROMETHAZINE-DM) 6.25-15 MG/5ML syrup Take 5 mLs by mouth 4 (four) times daily as needed for cough. 118 mL 0   No  facility-administered medications prior to visit.    Allergies  Allergen Reactions   Hydrochlorothiazide Other (See Comments)    Sun Sensitivity Sweating    Ace Inhibitors Other (See Comments)    Other reaction(s): Cough (ALLERGY/intolerance)    Morphine And Related Hives and Itching    ROS Review of Systems  Constitutional: Negative.   HENT: Negative.    Eyes:  Negative for photophobia and visual disturbance.  Respiratory: Negative.    Cardiovascular: Negative.   Gastrointestinal: Negative.   Endocrine: Negative for polyphagia and polyuria.  Genitourinary: Negative.   Musculoskeletal:  Negative for gait problem and joint swelling.  Skin:  Negative for pallor and rash.  Neurological:  Negative for light-headedness and headaches.  Hematological:  Does not bruise/bleed easily.  Psychiatric/Behavioral: Negative.       Objective:    Physical Exam Vitals and nursing note reviewed.   Constitutional:      General: He is not in acute distress.    Appearance: Normal appearance. He is obese. He is not ill-appearing, toxic-appearing or diaphoretic.  HENT:     Head: Normocephalic and atraumatic.     Right Ear: Tympanic membrane, ear canal and external ear normal.     Left Ear: Tympanic membrane, ear canal and external ear normal.     Mouth/Throat:     Mouth: Mucous membranes are moist.     Pharynx: Oropharynx is clear. No oropharyngeal exudate or posterior oropharyngeal erythema.  Eyes:     General: No scleral icterus.       Right eye: No discharge.        Left eye: No discharge.     Extraocular Movements: Extraocular movements intact.     Conjunctiva/sclera: Conjunctivae normal.     Pupils: Pupils are equal, round, and reactive to light.  Neck:     Vascular: No carotid bruit.  Cardiovascular:     Rate and Rhythm: Normal rate and regular rhythm.  Pulmonary:     Effort: Pulmonary effort is normal.     Breath sounds: Normal breath sounds.  Abdominal:     General: Bowel sounds are normal. There is no distension.     Palpations: There is no mass.     Tenderness: There is no abdominal tenderness. There is no guarding or rebound.     Hernia: No hernia is present. There is no hernia in the left inguinal area or right inguinal area.  Genitourinary:    Penis: Circumcised. No hypospadias, erythema, tenderness, discharge, swelling or lesions.      Testes:        Right: Mass, tenderness or swelling not present. Right testis is descended.        Left: Mass, tenderness or swelling not present. Left testis is descended.     Epididymis:     Right: Not inflamed or enlarged.     Left: Not inflamed or enlarged.  Musculoskeletal:     Cervical back: No rigidity or tenderness.  Lymphadenopathy:     Cervical: No cervical adenopathy.     Lower Body: No right inguinal adenopathy. No left inguinal adenopathy.  Skin:    General: Skin is warm and dry.  Neurological:     Mental  Status: He is alert and oriented to person, place, and time.  Psychiatric:        Mood and Affect: Mood normal.        Behavior: Behavior normal.    BP (!) 162/100 (BP Location: Left Arm, Patient Position: Sitting, Cuff Size: Large)  Pulse 83   Temp (!) 97 F (36.1 C) (Temporal)   Ht 5\' 11"  (1.803 m)   Wt 270 lb 12.8 oz (122.8 kg)   SpO2 98%   BMI 37.77 kg/m  Wt Readings from Last 3 Encounters:  09/02/20 270 lb 12.8 oz (122.8 kg)  01/27/20 261 lb (118.4 kg)  07/28/19 263 lb (119.3 kg)     Health Maintenance Due  Topic Date Due   Pneumococcal Vaccine 24-86 Years old (1 - PCV) Never done   Hepatitis C Screening  Never done    There are no preventive care reminders to display for this patient.  No results found for: TSH Lab Results  Component Value Date   WBC 11.5 (H) 04/24/2019   HGB 16.9 04/24/2019   HCT 49.6 04/24/2019   MCV 86.1 04/24/2019   PLT 254.0 04/24/2019   Lab Results  Component Value Date   NA 137 04/24/2019   K 4.3 04/24/2019   CO2 27 04/24/2019   GLUCOSE 98 04/24/2019   BUN 16 04/24/2019   CREATININE 1.04 04/24/2019   BILITOT 0.9 04/24/2019   ALKPHOS 74 04/24/2019   AST 20 04/24/2019   ALT 27 04/24/2019   PROT 6.9 04/24/2019   ALBUMIN 4.5 04/24/2019   CALCIUM 9.6 04/24/2019   GFR 76.89 04/24/2019   Lab Results  Component Value Date   CHOL 209 (H) 09/11/2017   Lab Results  Component Value Date   HDL 40.40 09/11/2017   Lab Results  Component Value Date   LDLCALC 133 (H) 09/11/2017   Lab Results  Component Value Date   TRIG 180.0 (H) 09/11/2017   Lab Results  Component Value Date   CHOLHDL 5 09/11/2017   No results found for: HGBA1C    Assessment & Plan:   Problem List Items Addressed This Visit       Cardiovascular and Mediastinum   Essential hypertension - Primary   Relevant Medications   amLODipine (NORVASC) 5 MG tablet   irbesartan (AVAPRO) 300 MG tablet   Other Relevant Orders   Comprehensive metabolic panel    Urinalysis, Routine w reflex microscopic     Other   Healthcare maintenance   Relevant Orders   CBC   Lipid panel   PSA   Hemoglobin A1c    Meds ordered this encounter  Medications   amLODipine (NORVASC) 5 MG tablet    Sig: Take 1 tablet (5 mg total) by mouth daily.    Dispense:  90 tablet    Refill:  1   irbesartan (AVAPRO) 300 MG tablet    Sig: TAKE 1 TABLET BY MOUTH ONCE DAILY**NEEDS TO COME IN FOR FOLLOW UP**    Dispense:  90 tablet    Refill:  0    Follow-up: Return in about 3 months (around 12/03/2020), or Try to lose some weight..  Added amlodipine for further blood pressure control.  He will obtain a new cuff and check and record his blood pressures and follow-up in 3 months.  Advised weight loss.  Given information on managing high blood pressure.  Also given information on health maintenance and disease prevention.  Advised him to exercise as well. 12/05/2020, MD

## 2020-11-23 ENCOUNTER — Other Ambulatory Visit: Payer: Self-pay

## 2020-11-23 ENCOUNTER — Encounter: Payer: Self-pay | Admitting: Family Medicine

## 2020-11-23 ENCOUNTER — Ambulatory Visit: Payer: 59 | Admitting: Family Medicine

## 2020-11-23 VITALS — BP 140/92 | HR 87 | Temp 97.3°F | Ht 71.0 in | Wt 246.4 lb

## 2020-11-23 DIAGNOSIS — E7800 Pure hypercholesterolemia, unspecified: Secondary | ICD-10-CM

## 2020-11-23 DIAGNOSIS — Z0000 Encounter for general adult medical examination without abnormal findings: Secondary | ICD-10-CM

## 2020-11-23 DIAGNOSIS — I10 Essential (primary) hypertension: Secondary | ICD-10-CM

## 2020-11-23 HISTORY — DX: Pure hypercholesterolemia, unspecified: E78.00

## 2020-11-23 MED ORDER — IRBESARTAN 300 MG PO TABS: ORAL_TABLET | ORAL | 0 refills | Status: DC

## 2020-11-23 MED ORDER — AMLODIPINE BESYLATE 10 MG PO TABS: 10.0000 mg | ORAL_TABLET | Freq: Every day | ORAL | 2 refills | Status: DC

## 2020-11-23 NOTE — Progress Notes (Signed)
Established Patient Office Visit  Subjective:  Patient ID: Corey [name], male    DOB: Feb 04, 1976  Age: 45 y.o. MRN: [id number]  CC:  Chief Complaint  Patient presents with   Follow-up    2 month follow up on BP, no concerns.     HPI Corey [name] presents for follow-up of hypertension, elevated cholesterol and health maintenance.  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  He is having no issues with amlodipine.  Denies swelling in his lower extremities.  He is sleeping better with less snoring.  Feels better with the weight loss.  Knows that he is at high risk for hypertension since both of his parents suffer from it.  Past Medical History:  Diagnosis Date   Hypertension    Kidney stones     Past Surgical History:  Procedure Laterality Date   COLONOSCOPY  05/22/2019   ESOPHAGOGASTRODUODENOSCOPY     over 25 years ago. Had a stomach ulcer and possibly dx of barretts esophagus   KNEE SURGERY Right    x2   LITHOTRIPSY     x2-3    UPPER GASTROINTESTINAL ENDOSCOPY  05/22/2019    Family History  Problem Relation Age of Onset   Arthritis Mother    Hypertension Mother    Hypertension Father    Kidney cancer Father        died 15    Asthma Sister    Asthma Sister    Colon cancer Paternal Aunt        died 92   Esophageal cancer Neg Hx    Colon polyps Neg Hx    Rectal cancer Neg Hx    Stomach cancer Neg Hx     Social History   Socioeconomic History   Marital status: Married    Spouse name: Not on file   Number of children: Not on file   Years of education: Not on file   Highest education level: Not on file  Occupational History   Not on file  Tobacco Use   Smoking status: Never   Smokeless tobacco: Never  Vaping Use   Vaping Use: Never used  Substance and Sexual Activity   Alcohol use: No   Drug use: No   Sexual activity: Not on file  Other Topics Concern    Not on file  Social History Narrative   Not on file   Social Determinants of Health   Financial Resource Strain: Not on file  Food Insecurity: Not on file  Transportation Needs: Not on file  Physical Activity: Not on file  Stress: Not on file  Social Connections: Not on file  Intimate Partner Violence: Not on file    Outpatient Medications Prior to Visit  Medication Sig Dispense Refill   amLODipine (NORVASC) 5 MG tablet Take 1 tablet (5 mg total) by mouth daily. 90 tablet 1   irbesartan (AVAPRO) 300 MG tablet TAKE 1 TABLET BY MOUTH ONCE DAILY**NEEDS TO COME IN FOR FOLLOW UP** 90 tablet 0   No facility-administered medications prior to visit.    Allergies  Allergen Reactions   Hydrochlorothiazide Other (See Comments)    Sun Sensitivity Sweating    Ace Inhibitors Other (See Comments)    Other reaction(s): Cough (ALLERGY/intolerance)    Morphine And Related Hives and Itching    ROS Review of Systems  Constitutional:  Negative for chills, diaphoresis, fatigue, fever and  unexpected weight change.  HENT: Negative.    Eyes:  Negative for photophobia and visual disturbance.  Respiratory:  Negative for shortness of breath and wheezing.   Cardiovascular:  Negative for chest pain and leg swelling.  Gastrointestinal:  Negative for abdominal pain and constipation.  Endocrine: Negative for polyphagia and polyuria.  Genitourinary: Negative.   Skin:  Negative for pallor and rash.  Neurological:  Negative for speech difficulty and weakness.  Hematological:  Does not bruise/bleed easily.  Psychiatric/Behavioral: Negative.       Objective:    Physical Exam Vitals and nursing note reviewed.  Constitutional:      General: He is not in acute distress.    Appearance: Normal appearance. He is not ill-appearing, toxic-appearing or diaphoretic.  HENT:     Head: Normocephalic and atraumatic.     Right Ear: External ear normal.     Left Ear: External ear normal.     Mouth/Throat:      Mouth: Mucous membranes are moist.     Pharynx: Oropharynx is clear. No oropharyngeal exudate or posterior oropharyngeal erythema.  Eyes:     General: No scleral icterus.       Right eye: No discharge.        Left eye: No discharge.     Extraocular Movements: Extraocular movements intact.     Conjunctiva/sclera: Conjunctivae normal.     Pupils: Pupils are equal, round, and reactive to light.  Cardiovascular:     Rate and Rhythm: Normal rate and regular rhythm.  Pulmonary:     Effort: Pulmonary effort is normal.     Breath sounds: Normal breath sounds.  Abdominal:     General: Bowel sounds are normal.  Musculoskeletal:     Cervical back: No rigidity or tenderness.     Right lower leg: No edema.     Left lower leg: No edema.  Lymphadenopathy:     Cervical: No cervical adenopathy.  Skin:    General: Skin is warm and dry.  Neurological:     Mental Status: He is alert and oriented to person, place, and time.  Psychiatric:        Mood and Affect: Mood normal.        Behavior: Behavior normal.    BP (!) 140/92 (BP Location: Left Arm, Patient Position: Sitting, Cuff Size: Large)   Pulse 87   Temp (!) 97.3 F (36.3 C) (Temporal)   Ht 5\' 11"  (1.803 m)   Wt 246 lb 6.4 oz (111.8 kg)   SpO2 98%   BMI 34.37 kg/m  Wt Readings from Last 3 Encounters:  11/23/20 246 lb 6.4 oz (111.8 kg)  09/02/20 270 lb 12.8 oz (122.8 kg)  01/27/20 261 lb (118.4 kg)     Health Maintenance Due  Topic Date Due   Hepatitis C Screening  Never done    There are no preventive care reminders to display for this patient.  No results found for: TSH Lab Results  Component Value Date   WBC 8.6 09/02/2020   HGB 17.1 (H) 09/02/2020   HCT 50.9 09/02/2020   MCV 86.5 09/02/2020   PLT 248.0 09/02/2020   Lab Results  Component Value Date   NA 139 09/02/2020   K 4.4 09/02/2020   CO2 27 09/02/2020   GLUCOSE 102 (H) 09/02/2020   BUN 17 09/02/2020   CREATININE 1.06 09/02/2020   BILITOT 0.7  09/02/2020   ALKPHOS 88 09/02/2020   AST 19 09/02/2020   ALT 32 09/02/2020  PROT 6.7 09/02/2020   ALBUMIN 4.6 09/02/2020   CALCIUM 9.3 09/02/2020   GFR 83.66 09/02/2020   Lab Results  Component Value Date   CHOL 248 (H) 09/02/2020   Lab Results  Component Value Date   HDL 41.30 09/02/2020   Lab Results  Component Value Date   LDLCALC 133 (H) 09/11/2017   Lab Results  Component Value Date   TRIG 231.0 (H) 09/02/2020   Lab Results  Component Value Date   CHOLHDL 6 09/02/2020   Lab Results  Component Value Date   HGBA1C 5.9 09/02/2020      Assessment & Plan:   Problem List Items Addressed This Visit       Cardiovascular and Mediastinum   Essential hypertension - Primary   Relevant Medications   amLODipine (NORVASC) 10 MG tablet   irbesartan (AVAPRO) 300 MG tablet     Other   Healthcare maintenance   Elevated LDL cholesterol level    Meds ordered this encounter  Medications   amLODipine (NORVASC) 10 MG tablet    Sig: Take 1 tablet (10 mg total) by mouth daily.    Dispense:  90 tablet    Refill:  2   irbesartan (AVAPRO) 300 MG tablet    Sig: TAKE 1 TABLET BY MOUTH ONCE DAILY    Dispense:  90 tablet    Refill:  0     Follow-up: Return in about 6 months (around 05/24/2021), or Keep up the good work..  Encouraged him to continue his weight loss journey and reach his goal of 210 pounds counseled that we could probably discontinue one of his BP meds.  Advised that he have a flu and bivalent Covid vaccines.  Also advised his weight loss with profile and help him to sleep better as well.  Information was given on managing hypertension and preventing high cholesterol.  He will continue to limit sodium intake.  Mliss Sax, MD

## 2020-12-22 ENCOUNTER — Other Ambulatory Visit: Payer: Self-pay

## 2020-12-22 ENCOUNTER — Ambulatory Visit (INDEPENDENT_AMBULATORY_CARE_PROVIDER_SITE_OTHER): Payer: 59

## 2020-12-22 ENCOUNTER — Ambulatory Visit: Payer: 59 | Admitting: Nurse Practitioner

## 2020-12-22 ENCOUNTER — Ambulatory Visit: Payer: 59 | Admitting: Family Medicine

## 2020-12-22 ENCOUNTER — Encounter: Payer: Self-pay | Admitting: Family Medicine

## 2020-12-22 VITALS — BP 150/90 | HR 103 | Temp 97.6°F | Ht 71.0 in | Wt 250.8 lb

## 2020-12-22 DIAGNOSIS — M7551 Bursitis of right shoulder: Secondary | ICD-10-CM | POA: Insufficient documentation

## 2020-12-22 DIAGNOSIS — M7521 Bicipital tendinitis, right shoulder: Secondary | ICD-10-CM

## 2020-12-22 DIAGNOSIS — M5412 Radiculopathy, cervical region: Secondary | ICD-10-CM

## 2020-12-22 HISTORY — DX: Radiculopathy, cervical region: M54.12

## 2020-12-22 HISTORY — DX: Bursitis of right shoulder: M75.51

## 2020-12-22 MED ORDER — PREDNISONE 10 MG (48) PO TBPK: ORAL_TABLET | ORAL | 0 refills | Status: DC

## 2020-12-22 MED ORDER — TRAMADOL HCL 50 MG PO TABS: ORAL_TABLET | ORAL | 0 refills | Status: DC

## 2020-12-22 NOTE — Progress Notes (Signed)
Established Patient Office Visit  Subjective:  Patient ID: Corey [name], male    DOB: 12-28-1975  Age: 45 y.o. MRN: [id number]  CC:  Chief Complaint  Patient presents with   Shoulder Pain    Right shoulder pain x 3-4 months becoming worse x 3 weeks.     HPI Corey [name] presents for evaluation and treatment of an ongoing history of right shoulder pain.  He is right-hand dominant.  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  No specific injury.  Denies neck pain or stiffness.  Denies paresthesias.  Pain radiates down the lateral arm towards his thumb.  Denies weakness in his arm or hand.  Pain is mostly located in the anterior part of his shoulder.  Past Medical History:  Diagnosis Date   Hypertension    Kidney stones     Past Surgical History:  Procedure Laterality Date   COLONOSCOPY  05/22/2019   ESOPHAGOGASTRODUODENOSCOPY     over 25 years ago. Had a stomach ulcer and possibly dx of barretts esophagus   KNEE SURGERY Right    x2   LITHOTRIPSY     x2-3    UPPER GASTROINTESTINAL ENDOSCOPY  05/22/2019    Family History  Problem Relation Age of Onset   Arthritis Mother    Hypertension Mother    Hypertension Father    Kidney cancer Father        died 71    Asthma Sister    Asthma Sister    Colon cancer Paternal Aunt        died 64   Esophageal cancer Neg Hx    Colon polyps Neg Hx    Rectal cancer Neg Hx    Stomach cancer Neg Hx     Social History   Socioeconomic History   Marital status: Married    Spouse name: Not on file   Number of children: Not on file   Years of education: Not on file   Highest education level: Not on file  Occupational History   Not on file  Tobacco Use   Smoking status: Never   Smokeless tobacco: Never  Vaping Use   Vaping Use: Never used  Substance and Sexual Activity   Alcohol use: No   Drug use: No   Sexual activity: Not on file  Other Topics Concern   Not on file   Social History Narrative   Not on file   Social Determinants of Health   Financial Resource Strain: Not on file  Food Insecurity: Not on file  Transportation Needs: Not on file  Physical Activity: Not on file  Stress: Not on file  Social Connections: Not on file  Intimate Partner Violence: Not on file    Outpatient Medications Prior to Visit  Medication Sig Dispense Refill   amLODipine (NORVASC) 10 MG tablet Take 1 tablet (10 mg total) by mouth daily. 90 tablet 2   irbesartan (AVAPRO) 300 MG tablet TAKE 1 TABLET BY MOUTH ONCE DAILY 90 tablet 0   No facility-administered medications prior to visit.    Allergies  Allergen Reactions   Hydrochlorothiazide Other (See Comments)    Sun Sensitivity Sweating    Ace Inhibitors Other (See Comments)    Other reaction(s): Cough (ALLERGY/intolerance)    Morphine And Related Hives and Itching    ROS Review of Systems  Constitutional:  Negative for diaphoresis, fatigue, fever and unexpected weight change.  Respiratory: Negative.  Cardiovascular: Negative.   Gastrointestinal: Negative.   Musculoskeletal:  Positive for arthralgias.  Neurological:  Negative for weakness and numbness.     Objective:    Physical Exam Vitals and nursing note reviewed.  Constitutional:      General: He is not in acute distress.    Appearance: Normal appearance. He is not ill-appearing, toxic-appearing or diaphoretic.  HENT:     Head: Normocephalic and atraumatic.     Right Ear: External ear normal.     Left Ear: External ear normal.  Eyes:     General: No scleral icterus.       Right eye: No discharge.        Left eye: No discharge.     Extraocular Movements: Extraocular movements intact.     Conjunctiva/sclera: Conjunctivae normal.  Pulmonary:     Effort: Pulmonary effort is normal.  Musculoskeletal:     Right shoulder: Tenderness and bony tenderness present. Normal range of motion.     Left shoulder: Normal.       Arms:     Cervical  back: No tenderness or bony tenderness. No pain with movement. Normal range of motion.       Back:  Skin:    General: Skin is warm and dry.  Neurological:     Mental Status: He is alert and oriented to person, place, and time.  Psychiatric:        Mood and Affect: Mood normal.    BP (!) 150/90 (BP Location: Left Arm, Patient Position: Sitting, Cuff Size: Large)   Pulse (!) 103   Temp 97.6 F (36.4 C) (Temporal)   Ht 5\' 11"  (1.803 m)   Wt 250 lb 12.8 oz (113.8 kg)   SpO2 98%   BMI 34.98 kg/m  Wt Readings from Last 3 Encounters:  12/22/20 250 lb 12.8 oz (113.8 kg)  11/23/20 246 lb 6.4 oz (111.8 kg)  09/02/20 270 lb 12.8 oz (122.8 kg)     Health Maintenance Due  Topic Date Due   Pneumococcal Vaccine 38-37 Years old (1 - PCV) Never done   Hepatitis C Screening  Never done    There are no preventive care reminders to display for this patient.  No results found for: TSH Lab Results  Component Value Date   WBC 8.6 09/02/2020   HGB 17.1 (H) 09/02/2020   HCT 50.9 09/02/2020   MCV 86.5 09/02/2020   PLT 248.0 09/02/2020   Lab Results  Component Value Date   NA 139 09/02/2020   K 4.4 09/02/2020   CO2 27 09/02/2020   GLUCOSE 102 (H) 09/02/2020   BUN 17 09/02/2020   CREATININE 1.06 09/02/2020   BILITOT 0.7 09/02/2020   ALKPHOS 88 09/02/2020   AST 19 09/02/2020   ALT 32 09/02/2020   PROT 6.7 09/02/2020   ALBUMIN 4.6 09/02/2020   CALCIUM 9.3 09/02/2020   GFR 83.66 09/02/2020   Lab Results  Component Value Date   CHOL 248 (H) 09/02/2020   Lab Results  Component Value Date   HDL 41.30 09/02/2020   Lab Results  Component Value Date   LDLCALC 133 (H) 09/11/2017   Lab Results  Component Value Date   TRIG 231.0 (H) 09/02/2020   Lab Results  Component Value Date   CHOLHDL 6 09/02/2020   Lab Results  Component Value Date   HGBA1C 5.9 09/02/2020      Assessment & Plan:   Problem List Items Addressed This Visit  Musculoskeletal and Integument    Biceps tendinitis of right upper extremity - Primary   Relevant Medications   predniSONE (STERAPRED UNI-PAK 48 TAB) 10 MG (48) TBPK tablet   traMADol (ULTRAM) 50 MG tablet   Other Relevant Orders   DG Shoulder Right   Ambulatory referral to Sports Medicine   Subacromial bursitis of right shoulder joint   Relevant Medications   predniSONE (STERAPRED UNI-PAK 48 TAB) 10 MG (48) TBPK tablet   traMADol (ULTRAM) 50 MG tablet   Other Relevant Orders   DG Shoulder Right   Ambulatory referral to Sports Medicine    Meds ordered this encounter  Medications   predniSONE (STERAPRED UNI-PAK 48 TAB) 10 MG (48) TBPK tablet    Sig: Pharmacy instructed 12-day Dosepak please    Dispense:  48 tablet    Refill:  0   traMADol (ULTRAM) 50 MG tablet    Sig: Take 1 or 2 at night as needed for pain.    Dispense:  30 tablet    Refill:  0    Follow-up: Return if symptoms worsen or fail to improve.    Mliss Sax, MD

## 2020-12-23 ENCOUNTER — Ambulatory Visit (INDEPENDENT_AMBULATORY_CARE_PROVIDER_SITE_OTHER): Payer: 59 | Admitting: Family Medicine

## 2020-12-23 ENCOUNTER — Encounter: Payer: Self-pay | Admitting: Family Medicine

## 2020-12-23 ENCOUNTER — Ambulatory Visit: Payer: Self-pay

## 2020-12-23 VITALS — BP 138/78 | Ht 71.0 in | Wt 250.0 lb

## 2020-12-23 DIAGNOSIS — M5412 Radiculopathy, cervical region: Secondary | ICD-10-CM

## 2020-12-23 DIAGNOSIS — M7551 Bursitis of right shoulder: Secondary | ICD-10-CM | POA: Diagnosis not present

## 2020-12-23 DIAGNOSIS — M25511 Pain in right shoulder: Secondary | ICD-10-CM

## 2020-12-23 NOTE — Patient Instructions (Signed)
Nice to meet you Please try the exercises  Please try heat   Please send me a message in MyChart with any questions or updates.  Please see me back in 3 weeks.   --Dr. Jordan Likes

## 2020-12-23 NOTE — Assessment & Plan Note (Signed)
He does have symptoms that radiate down to his hand that seem more radicular in nature. -Counseled on home exercise therapy and supportive care. -Counseled on prednisone. -Could consider imaging

## 2020-12-23 NOTE — Assessment & Plan Note (Signed)
Bursitis was appreciated on exam. -Counseled on home exercise therapy and supportive care. -Counseled on prednisone. -Could consider injection.

## 2020-12-23 NOTE — Progress Notes (Signed)
Corey [name] - 45 y.o. male MRN [id number]  Date of birth: February 11, 1975  SUBJECTIVE:  Including CC & ROS.  No chief complaint on file.   Corey [name] is a 45 y.o. male that is presenting with right shoulder and arm pain.  At times the pain does go down to his hand and feels numbness.  Has been going on for the past 3 months.  Independent review of the right shoulder x-ray from 11/17 shows no acute changes.   Review of Systems See HPI   HISTORY: Past Medical, Surgical, Social, and Family History Reviewed & Updated per EMR.   Pertinent Historical Findings include:  Past Medical History:  Diagnosis Date   Hypertension    Kidney stones     Past Surgical History:  Procedure Laterality Date   COLONOSCOPY  05/22/2019   ESOPHAGOGASTRODUODENOSCOPY     over 25 years ago. Had a stomach ulcer and possibly dx of barretts esophagus   KNEE SURGERY Right    x2   LITHOTRIPSY     x2-3    UPPER GASTROINTESTINAL ENDOSCOPY  05/22/2019    Family History  Problem Relation Age of Onset   Arthritis Mother    Hypertension Mother    Hypertension Father    Kidney cancer Father        died 34    Asthma Sister    Asthma Sister    Colon cancer Paternal Aunt        died 9   Esophageal cancer Neg Hx    Colon polyps Neg Hx    Rectal cancer Neg Hx    Stomach cancer Neg Hx     Social History   Socioeconomic History   Marital status: Married    Spouse name: Not on file   Number of children: Not on file   Years of education: Not on file   Highest education level: Not on file  Occupational History   Not on file  Tobacco Use   Smoking status: Never   Smokeless tobacco: Never  Vaping Use   Vaping Use: Never used  Substance and Sexual Activity   Alcohol use: No   Drug use: No   Sexual activity: Not on file  Other Topics Concern   Not on file  Social History Narrative   Not on file   Social Determinants of Health   Financial Resource Strain: Not on file  Food Insecurity: Not on  file  Transportation Needs: Not on file  Physical Activity: Not on file  Stress: Not on file  Social Connections: Not on file  Intimate Partner Violence: Not on file     PHYSICAL EXAM:  VS: BP 138/78 (BP Location: Left Arm, Patient Position: Sitting)   Ht 5\' 11"  (1.803 m)   Wt 250 lb (113.4 kg)   BMI 34.87 kg/m  Physical Exam Gen: NAD, alert, cooperative with exam, well-appearing   Limited ultrasound: Right shoulder:  Normal-appearing biceps tendon. Thickening appreciated at the insertion of the subscapularis with overlying bursitis. Normal-appearing supraspinatus with overlying bursitis. Normal-appearing posterior glenohumeral joint  Summary: Bursitis appreciated  Ultrasound and interpretation by , MD     ASSESSMENT & PLAN:   Cervical radiculopathy He does have symptoms that radiate down to his hand that seem more radicular in nature. -Counseled on home exercise therapy and supportive care. -Counseled on prednisone. -Could consider imaging   Subacromial bursitis of right shoulder joint Bursitis was appreciated on exam. -Counseled on home exercise therapy and supportive care. -  Counseled on prednisone. -Could consider injection.

## 2021-01-13 ENCOUNTER — Ambulatory Visit: Payer: 59 | Admitting: Family Medicine

## 2021-03-29 ENCOUNTER — Other Ambulatory Visit: Payer: Self-pay | Admitting: Family Medicine

## 2021-03-29 DIAGNOSIS — I10 Essential (primary) hypertension: Secondary | ICD-10-CM

## 2021-05-24 ENCOUNTER — Ambulatory Visit: Payer: 59 | Admitting: Family Medicine

## 2021-05-24 ENCOUNTER — Encounter: Payer: Self-pay | Admitting: Family Medicine

## 2021-05-24 VITALS — BP 146/82 | HR 86 | Temp 97.6°F | Ht 71.0 in | Wt 258.6 lb

## 2021-05-24 DIAGNOSIS — M5412 Radiculopathy, cervical region: Secondary | ICD-10-CM | POA: Diagnosis not present

## 2021-05-24 DIAGNOSIS — E7800 Pure hypercholesterolemia, unspecified: Secondary | ICD-10-CM | POA: Diagnosis not present

## 2021-05-24 DIAGNOSIS — I10 Essential (primary) hypertension: Secondary | ICD-10-CM | POA: Diagnosis not present

## 2021-05-24 DIAGNOSIS — E781 Pure hyperglyceridemia: Secondary | ICD-10-CM

## 2021-05-24 LAB — LIPID PANEL
Cholesterol: 244 mg/dL — ABNORMAL HIGH (ref 0–200)
HDL: 54.3 mg/dL (ref >39.00)
LDL Cholesterol: 167 mg/dL — ABNORMAL HIGH (ref 0–99)
NonHDL: 189.73
Total CHOL/HDL Ratio: 4
Triglycerides: 114 mg/dL (ref 0.0–149.0)
VLDL: 22.8 mg/dL (ref 0.0–40.0)

## 2021-05-24 LAB — URINALYSIS, ROUTINE W REFLEX MICROSCOPIC
Bilirubin Urine: NEGATIVE
Hgb urine dipstick: NEGATIVE
Ketones, ur: NEGATIVE
Leukocytes,Ua: NEGATIVE
Nitrite: NEGATIVE
RBC / HPF: NONE SEEN (ref 0–?)
Specific Gravity, Urine: 1.02 (ref 1.000–1.030)
Total Protein, Urine: NEGATIVE
Urine Glucose: NEGATIVE
Urobilinogen, UA: 0.2 (ref 0.0–1.0)
pH: 6 (ref 5.0–8.0)

## 2021-05-24 LAB — COMPREHENSIVE METABOLIC PANEL
ALT: 24 U/L (ref 0–53)
AST: 16 U/L (ref 0–37)
Albumin: 4.6 g/dL (ref 3.5–5.2)
Alkaline Phosphatase: 85 U/L (ref 39–117)
BUN: 15 mg/dL (ref 6–23)
CO2: 28 mEq/L (ref 19–32)
Calcium: 9.2 mg/dL (ref 8.4–10.5)
Chloride: 104 mEq/L (ref 96–112)
Creatinine, Ser: 0.96 mg/dL (ref 0.40–1.50)
GFR: 93.75 mL/min (ref >60.00)
Glucose, Bld: 102 mg/dL — ABNORMAL HIGH (ref 70–99)
Potassium: 4.1 mEq/L (ref 3.5–5.1)
Sodium: 141 mEq/L (ref 135–145)
Total Bilirubin: 0.7 mg/dL (ref 0.2–1.2)
Total Protein: 6.6 g/dL (ref 6.0–8.3)

## 2021-05-24 LAB — CBC
HCT: 48.7 % (ref 39.0–52.0)
Hemoglobin: 16.3 g/dL (ref 13.0–17.0)
MCHC: 33.4 g/dL (ref 30.0–36.0)
MCV: 86.8 fl (ref 78.0–100.0)
Platelets: 266 10*3/uL (ref 150.0–400.0)
RBC: 5.61 Mil/uL (ref 4.22–5.81)
RDW: 13.5 % (ref 11.5–15.5)
WBC: 10.1 10*3/uL (ref 4.0–10.5)

## 2021-05-24 LAB — LDL CHOLESTEROL, DIRECT: Direct LDL: 174 mg/dL

## 2021-05-24 MED ORDER — SPIRONOLACTONE 25 MG PO TABS: 25.0000 mg | ORAL_TABLET | Freq: Every day | ORAL | 3 refills | Status: DC

## 2021-05-24 MED ORDER — CYCLOBENZAPRINE HCL 10 MG PO TABS: 10.0000 mg | ORAL_TABLET | Freq: Three times a day (TID) | ORAL | 0 refills | Status: DC | PRN

## 2021-05-24 NOTE — Progress Notes (Signed)
? ?Established Patient Office Visit ? ?Subjective   ?Patient ID: Corey [name], male    DOB: 07-23-1975  Age: 46 y.o. MRN: [id number] ? ?Chief Complaint  ?Patient presents with  ? Follow-up  ?  6 month follow up no concerns. Patient fasting.   ? ? ?HPI follow-up of hypertension, elevated cholesterol and an established cervical radiculopathy.  Pain and stiffness persists in his neck there is radiation of the pain down to his right thumb.  He is right-hand dominant.  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  He has had physical therapy and it is helped a little.  Weight has increased unfortunately blood pressure has as well.  He is compliant with Avapro and amlodipine. ? ? ? ?Review of Systems  ?Constitutional: Negative.   ?HENT: Negative.    ?Eyes:  Negative for blurred vision, discharge and redness.  ?Respiratory: Negative.    ?Cardiovascular: Negative.   ?Gastrointestinal:  Negative for abdominal pain.  ?Genitourinary: Negative.   ?Musculoskeletal:  Positive for neck pain. Negative for myalgias.  ?Skin:  Negative for rash.  ?Neurological:  Positive for tingling. Negative for loss of consciousness and weakness.  ?Endo/Heme/Allergies:  Negative for polydipsia.   ? ?  ?Objective:  ?  ? ?BP (!) 146/82 (BP Location: Left Arm, Patient Position: Sitting, Cuff Size: Large)   Pulse 86   Temp 97.6 ?F (36.4 ?C) (Temporal)   Ht 5\' 11"  (1.803 m)   Wt 258 lb 9.6 oz (117.3 kg)   SpO2 96%   BMI 36.07 kg/m?  ?BP Readings from Last 3 Encounters:  ?05/24/21 (!) 146/82  ?12/23/20 138/78  ?12/22/20 (!) 150/90  ? ?Wt Readings from Last 3 Encounters:  ?05/24/21 258 lb 9.6 oz (117.3 kg)  ?12/23/20 250 lb (113.4 kg)  ?12/22/20 250 lb 12.8 oz (113.8 kg)  ? ?  ? ?Physical Exam ?Constitutional:   ?   General: He is not in acute distress. ?   Appearance: Normal appearance. He is not ill-appearing, toxic-appearing or diaphoretic.  ?HENT:  ?   Head: Normocephalic and atraumatic.  ?   Right Ear: External ear  normal.  ?   Left Ear: External ear normal.  ?Eyes:  ?   General: No scleral icterus.    ?   Right eye: No discharge.     ?   Left eye: No discharge.  ?   Extraocular Movements: Extraocular movements intact.  ?   Conjunctiva/sclera: Conjunctivae normal.  ?   Pupils: Pupils are equal, round, and reactive to light.  ?Cardiovascular:  ?   Rate and Rhythm: Normal rate and regular rhythm.  ?Pulmonary:  ?   Effort: Pulmonary effort is normal. No respiratory distress.  ?   Breath sounds: Normal breath sounds.  ?Abdominal:  ?   General: Bowel sounds are normal.  ?   Tenderness: There is no abdominal tenderness. There is no guarding.  ?Musculoskeletal:  ?   Cervical back: No rigidity or tenderness. Pain with movement present. Decreased range of motion.  ?Skin: ?   General: Skin is warm and dry.  ?Neurological:  ?   Mental Status: He is alert and oriented to person, place, and time.  ?   Motor: No weakness.  ?Psychiatric:     ?   Mood and Affect: Mood normal.     ?   Behavior: Behavior normal.  ? ? ? ?No results found for any visits on 05/24/21. ? ? ? ?The 10-year ASCVD  risk score (Arnett DK, et al., 2019) is: 7.1% ? ?  ?Assessment & Plan:  ? ?Problem List Items Addressed This Visit   ? ?  ? Cardiovascular and Mediastinum  ? Essential hypertension - Primary  ? Relevant Medications  ? spironolactone (ALDACTONE) 25 MG tablet  ? Other Relevant Orders  ? CBC  ? Comprehensive metabolic panel  ? Urinalysis, Routine w reflex microscopic  ?  ? Nervous and Auditory  ? C6 radiculopathy  ? Relevant Medications  ? cyclobenzaprine (FLEXERIL) 10 MG tablet  ? Other Relevant Orders  ? MR Cervical Spine Wo Contrast  ?  ? Other  ? Elevated LDL cholesterol level  ? Relevant Orders  ? Comprehensive metabolic panel  ? LDL cholesterol, direct  ? Lipid panel  ? ? ?Return in about 8 weeks (around 07/19/2021).  ?Have added Aldactone to blood pressure regimen.  We will try to lose some weight.  Information was given on managing hypertension.   Information given on managing hyperlipidemia.  MRI for C6 radiculopathy. ? ?Mliss Sax, MD ? ?

## 2021-05-26 ENCOUNTER — Other Ambulatory Visit: Payer: Self-pay | Admitting: Family Medicine

## 2021-05-26 DIAGNOSIS — M7551 Bursitis of right shoulder: Secondary | ICD-10-CM

## 2021-05-26 DIAGNOSIS — M7521 Bicipital tendinitis, right shoulder: Secondary | ICD-10-CM

## 2021-05-26 NOTE — Telephone Encounter (Signed)
Pts wife called requesting refill on tramadal ... She said Dr Ethelene Hal said he would call it in at his last appt .  ?

## 2021-05-26 NOTE — Telephone Encounter (Signed)
Refill request for pending Rx please advise message below last OV 05/24/21 last refill November 2022 ?

## 2021-05-29 ENCOUNTER — Telehealth: Payer: Self-pay | Admitting: Family Medicine

## 2021-05-29 MED ORDER — TRAMADOL HCL 50 MG PO TABS: ORAL_TABLET | ORAL | 0 refills | Status: DC

## 2021-05-29 NOTE — Telephone Encounter (Signed)
Patient aware to stop Aldactone check and record BP readings appointment scheduled for follow up.  ?

## 2021-05-29 NOTE — Telephone Encounter (Signed)
FYI:Pt is calling in thinking he is having an allergic reaction to spironolactone (ALDACTONE) 25 MG tablet [[id number]]. He took this med on Saturday night and woke up Sunday morning having flu like symptoms, dizzy, light headed with red and hot skin. I transferred him over to nurse triage.  ?

## 2021-05-29 NOTE — Telephone Encounter (Signed)
Please advise message below  °

## 2021-06-05 ENCOUNTER — Encounter: Payer: Self-pay | Admitting: Family Medicine

## 2021-06-05 DIAGNOSIS — M5412 Radiculopathy, cervical region: Secondary | ICD-10-CM

## 2021-06-14 NOTE — Telephone Encounter (Signed)
Please advise message below per patient for insurance will not approve MRI need new order placed with different diagnosis.  ?

## 2021-06-20 NOTE — Telephone Encounter (Signed)
Have checked with referrals and informed of dx change. Tried calling patient to inform that things are still in process, no answer will call patient back.  ?

## 2021-06-26 ENCOUNTER — Ambulatory Visit: Payer: 59 | Admitting: Family Medicine

## 2021-06-29 ENCOUNTER — Other Ambulatory Visit: Payer: Self-pay | Admitting: Family Medicine

## 2021-06-29 DIAGNOSIS — I10 Essential (primary) hypertension: Secondary | ICD-10-CM

## 2021-07-06 ENCOUNTER — Other Ambulatory Visit: Payer: Self-pay

## 2021-07-06 DIAGNOSIS — I10 Essential (primary) hypertension: Secondary | ICD-10-CM

## 2021-07-06 MED ORDER — IRBESARTAN 300 MG PO TABS: 300.0000 mg | ORAL_TABLET | Freq: Every day | ORAL | 0 refills | Status: DC

## 2021-08-28 ENCOUNTER — Other Ambulatory Visit: Payer: Self-pay | Admitting: Family Medicine

## 2021-08-28 DIAGNOSIS — I10 Essential (primary) hypertension: Secondary | ICD-10-CM

## 2021-09-19 ENCOUNTER — Other Ambulatory Visit: Payer: Self-pay | Admitting: Family Medicine

## 2021-09-19 DIAGNOSIS — I10 Essential (primary) hypertension: Secondary | ICD-10-CM

## 2021-10-03 ENCOUNTER — Encounter: Payer: Self-pay | Admitting: Family Medicine

## 2021-10-03 ENCOUNTER — Ambulatory Visit: Payer: 59 | Admitting: Family Medicine

## 2021-10-03 VITALS — BP 138/90 | HR 86 | Temp 97.0°F | Ht 71.0 in | Wt 261.2 lb

## 2021-10-03 DIAGNOSIS — S8011XA Contusion of right lower leg, initial encounter: Secondary | ICD-10-CM | POA: Diagnosis not present

## 2021-10-03 DIAGNOSIS — I10 Essential (primary) hypertension: Secondary | ICD-10-CM | POA: Diagnosis not present

## 2021-10-03 NOTE — Progress Notes (Signed)
Pends   Established Patient Office Visit  Subjective   Patient ID: Corey [name], male    DOB: December 29, 1975  Age: 46 y.o. MRN: [id number]  Chief Complaint  Patient presents with   Leg Injury    Hit with baseball on right shin 3 weeks ago still painful and tender to touch.     HPI for evaluation of contusion of right medial distal tibia.  Remains swollen and tender.  No pain with weightbearing.  Wife is [redacted] weeks pregnant.    Review of Systems  Constitutional: Negative.   HENT: Negative.    Eyes:  Negative for blurred vision, discharge and redness.  Respiratory: Negative.    Cardiovascular: Negative.   Gastrointestinal:  Negative for abdominal pain.  Genitourinary: Negative.   Musculoskeletal:  Negative for joint pain and myalgias.  Skin:  Negative for rash.  Neurological:  Negative for tingling, loss of consciousness and weakness.  Endo/Heme/Allergies:  Negative for polydipsia.      Objective:     BP (!) 138/90 (BP Location: Left Arm, Patient Position: Sitting, Cuff Size: Large)   Pulse 86   Temp (!) 97 F (36.1 C) (Temporal)   Ht 5\' 11"  (1.803 m)   Wt 261 lb 3.2 oz (118.5 kg)   SpO2 95%   BMI 36.43 kg/m  BP Readings from Last 3 Encounters:  10/03/21 (!) 138/90  05/24/21 (!) 146/82  12/23/20 138/78   Wt Readings from Last 3 Encounters:  10/03/21 261 lb 3.2 oz (118.5 kg)  05/24/21 258 lb 9.6 oz (117.3 kg)  12/23/20 250 lb (113.4 kg)      Physical Exam Constitutional:      General: He is not in acute distress.    Appearance: Normal appearance. He is obese. He is not ill-appearing, toxic-appearing or diaphoretic.  HENT:     Head: Normocephalic and atraumatic.     Right Ear: External ear normal.     Left Ear: External ear normal.  Eyes:     General: No scleral icterus.       Right eye: No discharge.        Left eye: No discharge.     Extraocular Movements: Extraocular movements intact.     Conjunctiva/sclera: Conjunctivae normal.  Cardiovascular:      Pulses:          Dorsalis pedis pulses are 2+ on the right side.       Posterior tibial pulses are 1+ on the right side.  Pulmonary:     Effort: Pulmonary effort is normal. No respiratory distress.  Musculoskeletal:     Right upper leg: Swelling and tenderness present.     Right foot: Normal range of motion. No swelling or tenderness.       Legs:     Comments: Right foot is pes planus.  Capillary refill is brisk  Skin:    General: Skin is warm and dry.  Neurological:     Mental Status: He is alert and oriented to person, place, and time.  Psychiatric:        Mood and Affect: Mood normal.        Behavior: Behavior normal.      No results found for any visits on 10/03/21.    The 10-year ASCVD risk score (Arnett DK, et al., 2019) is: 4.6%    Assessment & Plan:   Problem List Items Addressed This Visit       Cardiovascular and Mediastinum   Essential hypertension   Other Visit  Diagnoses     Contusion of right lower extremity, initial encounter    -  Primary   Relevant Orders   DG Tibia/Fibula Right       Return if symptoms worsen or fail to improve.  Will return for films of the tibia and fibula.  Advised tincture of time.  We will check an cord blood pressures periodically.  will strive to lose weight.  Mliss Sax, MD

## 2021-10-04 ENCOUNTER — Other Ambulatory Visit: Payer: 59

## 2021-10-04 ENCOUNTER — Ambulatory Visit (INDEPENDENT_AMBULATORY_CARE_PROVIDER_SITE_OTHER): Payer: 59

## 2021-10-04 DIAGNOSIS — S8011XA Contusion of right lower leg, initial encounter: Secondary | ICD-10-CM

## 2021-10-04 NOTE — Progress Notes (Unsigned)
Initial visit for contusion of  RT medial distal tibia. He was hit with a baseball 3 weeks ago Still has tenderness and swelling.

## 2021-10-20 ENCOUNTER — Other Ambulatory Visit: Payer: Self-pay | Admitting: Family Medicine

## 2021-10-20 DIAGNOSIS — I10 Essential (primary) hypertension: Secondary | ICD-10-CM

## 2021-12-29 ENCOUNTER — Other Ambulatory Visit: Payer: Self-pay | Admitting: Family Medicine

## 2021-12-29 DIAGNOSIS — I10 Essential (primary) hypertension: Secondary | ICD-10-CM

## 2022-01-22 ENCOUNTER — Other Ambulatory Visit: Payer: Self-pay | Admitting: Family Medicine

## 2022-01-22 DIAGNOSIS — I10 Essential (primary) hypertension: Secondary | ICD-10-CM

## 2022-02-06 ENCOUNTER — Telehealth: Payer: Self-pay | Admitting: Family Medicine

## 2022-02-06 NOTE — Telephone Encounter (Signed)
Pt would like to be referred to a different Dermatology and pt stated that [facility] is closed

## 2022-02-14 NOTE — Telephone Encounter (Signed)
No history of referral in patients chart to dermatologist patient due for annual. Scheduled CPE will discuss dermatologist appointment at visit

## 2022-02-27 ENCOUNTER — Ambulatory Visit: Payer: 59 | Admitting: Family Medicine

## 2022-02-27 ENCOUNTER — Encounter: Payer: Self-pay | Admitting: Family Medicine

## 2022-02-27 VITALS — BP 144/80 | HR 65 | Temp 97.0°F | Ht 71.0 in | Wt 271.8 lb

## 2022-02-27 DIAGNOSIS — Z6379 Other stressful life events affecting family and household: Secondary | ICD-10-CM

## 2022-02-27 DIAGNOSIS — R1084 Generalized abdominal pain: Secondary | ICD-10-CM

## 2022-02-27 DIAGNOSIS — R350 Frequency of micturition: Secondary | ICD-10-CM

## 2022-02-27 DIAGNOSIS — J0100 Acute maxillary sinusitis, unspecified: Secondary | ICD-10-CM

## 2022-02-27 DIAGNOSIS — I10 Essential (primary) hypertension: Secondary | ICD-10-CM | POA: Diagnosis not present

## 2022-02-27 HISTORY — DX: Frequency of micturition: R35.0

## 2022-02-27 HISTORY — DX: Other stressful life events affecting family and household: Z63.79

## 2022-02-27 HISTORY — DX: Generalized abdominal pain: R10.84

## 2022-02-27 LAB — CBC WITH DIFFERENTIAL/PLATELET
Basophils Absolute: 0.1 10*3/uL (ref 0.0–0.1)
Basophils Relative: 0.5 % (ref 0.0–3.0)
Eosinophils Absolute: 0.2 10*3/uL (ref 0.0–0.7)
Eosinophils Relative: 1.4 % (ref 0.0–5.0)
HCT: 48 % (ref 39.0–52.0)
Hemoglobin: 16.2 g/dL (ref 13.0–17.0)
Lymphocytes Relative: 28.5 % (ref 12.0–46.0)
Lymphs Abs: 3.4 10*3/uL (ref 0.7–4.0)
MCHC: 33.8 g/dL (ref 30.0–36.0)
MCV: 86.3 fl (ref 78.0–100.0)
Monocytes Absolute: 0.9 10*3/uL (ref 0.1–1.0)
Monocytes Relative: 7.8 % (ref 3.0–12.0)
Neutro Abs: 7.3 10*3/uL (ref 1.4–7.7)
Neutrophils Relative %: 61.8 % (ref 43.0–77.0)
Platelets: 299 10*3/uL (ref 150.0–400.0)
RBC: 5.56 Mil/uL (ref 4.22–5.81)
RDW: 13.8 % (ref 11.5–15.5)
WBC: 11.8 10*3/uL — ABNORMAL HIGH (ref 4.0–10.5)

## 2022-02-27 LAB — URINALYSIS, ROUTINE W REFLEX MICROSCOPIC
Bilirubin Urine: NEGATIVE
Hgb urine dipstick: NEGATIVE
Ketones, ur: NEGATIVE
Leukocytes,Ua: NEGATIVE
Nitrite: NEGATIVE
RBC / HPF: NONE SEEN (ref 0–?)
Specific Gravity, Urine: 1.025 (ref 1.000–1.030)
Total Protein, Urine: NEGATIVE
Urine Glucose: NEGATIVE
Urobilinogen, UA: 0.2 (ref 0.0–1.0)
pH: 6 (ref 5.0–8.0)

## 2022-02-27 LAB — COMPREHENSIVE METABOLIC PANEL
ALT: 22 U/L (ref 0–53)
AST: 14 U/L (ref 0–37)
Albumin: 4.5 g/dL (ref 3.5–5.2)
Alkaline Phosphatase: 83 U/L (ref 39–117)
BUN: 15 mg/dL (ref 6–23)
CO2: 30 mEq/L (ref 19–32)
Calcium: 9.2 mg/dL (ref 8.4–10.5)
Chloride: 102 mEq/L (ref 96–112)
Creatinine, Ser: 0.98 mg/dL (ref 0.40–1.50)
GFR: 90.97 mL/min (ref >60.00)
Glucose, Bld: 92 mg/dL (ref 70–99)
Potassium: 4 mEq/L (ref 3.5–5.1)
Sodium: 141 mEq/L (ref 135–145)
Total Bilirubin: 0.7 mg/dL (ref 0.2–1.2)
Total Protein: 6.6 g/dL (ref 6.0–8.3)

## 2022-02-27 LAB — AMYLASE: Amylase: 29 U/L (ref 27–131)

## 2022-02-27 MED ORDER — AMOXICILLIN 875 MG PO TABS: 875.0000 mg | ORAL_TABLET | Freq: Two times a day (BID) | ORAL | 0 refills | Status: AC

## 2022-02-27 NOTE — Progress Notes (Signed)
Established Patient Office Visit   Subjective:  Patient ID: Corey [name], male    DOB: 01/16/1976  Age: 47 y.o. MRN: [id number]  Chief Complaint  Patient presents with  . Sore Throat    Sore throat, cough, low fever sinus issue x 1 week. Lower abdominal pains yesterday.     Sore Throat  Associated symptoms include abdominal pain, congestion, coughing and diarrhea. Pertinent negatives include no shortness of breath or vomiting.   Encounter Diagnoses  Name Primary?  . Acute non-recurrent maxillary sinusitis Yes  . Generalized abdominal pain   . Urinary frequency   . Essential hypertension   . Stressful life event affecting family    Presents with a 6 to 7-day history of URI signs and symptoms with nasal congestion, purulent rhinorrhea, facial pressure and teeth discomfort.  There is a mild cough without wheezing or difficulty breathing.  He has a 1 day history of loose to watery stool with elevated temperature and generalized abdominal pain.  His wife is [redacted] weeks pregnant and due at any moment.  He has noted some urinary frequency but denies discharge, dysuria or hematic urea.  History of renal lithiasis.   Review of Systems  Constitutional: Negative.   HENT:  Positive for congestion and sinus pain.   Eyes:  Negative for blurred vision, discharge and redness.  Respiratory:  Positive for cough. Negative for hemoptysis, sputum production, shortness of breath and wheezing.   Cardiovascular: Negative.   Gastrointestinal:  Positive for abdominal pain and diarrhea. Negative for blood in stool, constipation, melena, nausea and vomiting.  Genitourinary:  Positive for frequency. Negative for dysuria, hematuria and urgency.  Musculoskeletal: Negative.  Negative for myalgias.  Skin:  Negative for rash.  Neurological:  Negative for tingling, loss of consciousness and weakness.  Endo/Heme/Allergies:  Negative for polydipsia.     Current Outpatient Medications:  .  amLODipine (NORVASC) 10  MG tablet, TAKE 1 TABLET BY MOUTH ONCE DAILY. **APPOINTMENT REQUIRED FOR FUTURE REFILLS.**, Disp: 90 tablet, Rfl: 1 .  amoxicillin (AMOXIL) 875 MG tablet, Take 1 tablet (875 mg total) by mouth 2 (two) times daily for 10 days., Disp: 20 tablet, Rfl: 0 .  irbesartan (AVAPRO) 300 MG tablet, Take 1 tablet by mouth once daily, Disp: 90 tablet, Rfl: 0   Objective:     BP (!) 144/80 (BP Location: Left Arm, Patient Position: Sitting, Cuff Size: Large)   Pulse 65   Temp (!) 97 F (36.1 C) (Temporal)   Ht 5\' 11"  (1.803 m)   Wt 271 lb 12.8 oz (123.3 kg)   SpO2 96%   BMI 37.91 kg/m  BP Readings from Last 3 Encounters:  02/27/22 (!) 144/80  10/03/21 (!) 138/90  05/24/21 (!) 146/82   Wt Readings from Last 3 Encounters:  02/27/22 271 lb 12.8 oz (123.3 kg)  10/03/21 261 lb 3.2 oz (118.5 kg)  05/24/21 258 lb 9.6 oz (117.3 kg)      Physical Exam Constitutional:      General: He is not in acute distress.    Appearance: Normal appearance. He is not ill-appearing, toxic-appearing or diaphoretic.  HENT:     Head: Normocephalic and atraumatic.     Comments: There is tenderness to palpation of the maxillary sinuses.    Right Ear: Tympanic membrane, ear canal and external ear normal.     Left Ear: Tympanic membrane, ear canal and external ear normal.     Mouth/Throat:     Mouth: Mucous membranes are moist.  Pharynx: Oropharynx is clear. No oropharyngeal exudate or posterior oropharyngeal erythema.  Eyes:     General: No scleral icterus.       Right eye: No discharge.        Left eye: No discharge.     Extraocular Movements: Extraocular movements intact.     Conjunctiva/sclera: Conjunctivae normal.     Pupils: Pupils are equal, round, and reactive to light.  Cardiovascular:     Rate and Rhythm: Normal rate and regular rhythm.  Pulmonary:     Effort: Pulmonary effort is normal. No respiratory distress.     Breath sounds: Normal breath sounds. No wheezing or rales.  Abdominal:      General: Bowel sounds are normal. There is no distension.     Tenderness: There is no abdominal tenderness (Mild generalized tenderness to palpation.). There is left CVA tenderness. There is no right CVA tenderness, guarding or rebound.  Musculoskeletal:     Cervical back: Neck supple. No rigidity or tenderness.  Lymphadenopathy:     Cervical: No cervical adenopathy.  Skin:    General: Skin is warm and dry.  Neurological:     Mental Status: He is alert and oriented to person, place, and time.  Psychiatric:        Mood and Affect: Mood normal.        Behavior: Behavior normal.     No results found for any visits on 02/27/22.    The 10-year ASCVD risk score (Arnett DK, et al., 2019) is: 5%    Assessment & Plan:   Acute non-recurrent maxillary sinusitis -     Amoxicillin; Take 1 tablet (875 mg total) by mouth 2 (two) times daily for 10 days.  Dispense: 20 tablet; Refill: 0  Generalized abdominal pain -     CBC with Differential/Platelet -     Comprehensive metabolic panel -     Amylase -     Urinalysis, Routine w reflex microscopic -     Urine Culture  Urinary frequency -     Urinalysis, Routine w reflex microscopic -     Urine Culture  Essential hypertension  Stressful life event affecting family    Return in about 8 weeks (around 04/24/2022), or if symptoms worsen or fail to improve.  Will return next week if not improving.  Screening workup for abdominal pain.  Stress is a factor in his elevated blood pressure.  Asked him to follow-up in 8 weeks assuming the above results were recheck of his blood pressure.  Libby Maw, MD

## 2022-02-28 LAB — URINE CULTURE
MICRO NUMBER:: 14461138
Result:: NO GROWTH
SPECIMEN QUALITY:: ADEQUATE

## 2022-03-14 ENCOUNTER — Other Ambulatory Visit: Payer: Self-pay | Admitting: Family Medicine

## 2022-03-14 DIAGNOSIS — I10 Essential (primary) hypertension: Secondary | ICD-10-CM

## 2022-05-07 ENCOUNTER — Encounter: Payer: Self-pay | Admitting: Family Medicine

## 2022-05-07 ENCOUNTER — Ambulatory Visit (INDEPENDENT_AMBULATORY_CARE_PROVIDER_SITE_OTHER): Payer: 59 | Admitting: Family Medicine

## 2022-05-07 VITALS — BP 132/86 | HR 91 | Temp 97.9°F | Ht 71.0 in | Wt 267.0 lb

## 2022-05-07 DIAGNOSIS — L309 Dermatitis, unspecified: Secondary | ICD-10-CM

## 2022-05-07 DIAGNOSIS — Z9852 Vasectomy status: Secondary | ICD-10-CM

## 2022-05-07 DIAGNOSIS — I10 Essential (primary) hypertension: Secondary | ICD-10-CM | POA: Diagnosis not present

## 2022-05-07 DIAGNOSIS — M1990 Unspecified osteoarthritis, unspecified site: Secondary | ICD-10-CM

## 2022-05-07 DIAGNOSIS — Z6837 Body mass index (BMI) 37.0-37.9, adult: Secondary | ICD-10-CM

## 2022-05-07 DIAGNOSIS — E669 Obesity, unspecified: Secondary | ICD-10-CM

## 2022-05-07 DIAGNOSIS — Z0001 Encounter for general adult medical examination with abnormal findings: Secondary | ICD-10-CM | POA: Diagnosis not present

## 2022-05-07 DIAGNOSIS — Z0000 Encounter for general adult medical examination without abnormal findings: Secondary | ICD-10-CM

## 2022-05-07 HISTORY — DX: Unspecified osteoarthritis, unspecified site: M19.90

## 2022-05-07 HISTORY — DX: Vasectomy status: Z98.52

## 2022-05-07 HISTORY — DX: Dermatitis, unspecified: L30.9

## 2022-05-07 HISTORY — DX: Obesity, unspecified: E66.9

## 2022-05-07 LAB — HEMOGLOBIN A1C: Hgb A1c MFr Bld: 5.8 % (ref 4.6–6.5)

## 2022-05-07 LAB — COMPREHENSIVE METABOLIC PANEL
ALT: 27 U/L (ref 0–53)
AST: 18 U/L (ref 0–37)
Albumin: 4.5 g/dL (ref 3.5–5.2)
Alkaline Phosphatase: 93 U/L (ref 39–117)
BUN: 13 mg/dL (ref 6–23)
CO2: 28 mEq/L (ref 19–32)
Calcium: 9.3 mg/dL (ref 8.4–10.5)
Chloride: 105 mEq/L (ref 96–112)
Creatinine, Ser: 1 mg/dL (ref 0.40–1.50)
GFR: 88.67 mL/min (ref >60.00)
Glucose, Bld: 100 mg/dL — ABNORMAL HIGH (ref 70–99)
Potassium: 4.3 mEq/L (ref 3.5–5.1)
Sodium: 138 mEq/L (ref 135–145)
Total Bilirubin: 0.5 mg/dL (ref 0.2–1.2)
Total Protein: 6.4 g/dL (ref 6.0–8.3)

## 2022-05-07 LAB — URINALYSIS, ROUTINE W REFLEX MICROSCOPIC
Bilirubin Urine: NEGATIVE
Hgb urine dipstick: NEGATIVE
Ketones, ur: NEGATIVE
Leukocytes,Ua: NEGATIVE
Nitrite: NEGATIVE
RBC / HPF: NONE SEEN (ref 0–?)
Specific Gravity, Urine: 1.02 (ref 1.000–1.030)
Total Protein, Urine: NEGATIVE
Urine Glucose: NEGATIVE
Urobilinogen, UA: 0.2 (ref 0.0–1.0)
pH: 6.5 (ref 5.0–8.0)

## 2022-05-07 LAB — LIPID PANEL
Cholesterol: 220 mg/dL — ABNORMAL HIGH (ref 0–200)
HDL: 44.1 mg/dL (ref >39.00)
LDL Cholesterol: 149 mg/dL — ABNORMAL HIGH (ref 0–99)
NonHDL: 175.68
Total CHOL/HDL Ratio: 5
Triglycerides: 133 mg/dL (ref 0.0–149.0)
VLDL: 26.6 mg/dL (ref 0.0–40.0)

## 2022-05-07 LAB — CBC
HCT: 50 % (ref 39.0–52.0)
Hemoglobin: 17 g/dL (ref 13.0–17.0)
MCHC: 33.9 g/dL (ref 30.0–36.0)
MCV: 86 fl (ref 78.0–100.0)
Platelets: 282 10*3/uL (ref 150.0–400.0)
RBC: 5.82 Mil/uL — ABNORMAL HIGH (ref 4.22–5.81)
RDW: 13.2 % (ref 11.5–15.5)
WBC: 8.4 10*3/uL (ref 4.0–10.5)

## 2022-05-07 LAB — C-REACTIVE PROTEIN: CRP: <1 mg/dL (ref 0.5–20.0)

## 2022-05-07 LAB — SEDIMENTATION RATE: Sed Rate: 2 mm/hr (ref 0–15)

## 2022-05-07 NOTE — Progress Notes (Signed)
   Established Patient Office Visit   Subjective:  Patient ID: Corey [name], male    DOB: [birth year]  Age: 47 y.o. MRN: [id number]  Chief Complaint  Patient presents with   Annual Exam    CPE, discuss spot on face pt would like referral to dermatology. Patient fasting.     HPI Encounter Diagnoses  Name Primary?   Healthcare maintenance Yes   BMI 37.0-37.9, adult    Vasectomy status    Facial dermatitis    Essential hypertension    Arthritis    For health check and follow-up of above.  Has regular dental care.  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  He is interested in pursuing a vasectomy.  He and his wife have 2 children 1 of which is a 71-month-old.  He expresses an interest in losing weight but is frustrated over the difficulty of doing so.  Blood pressure is well-controlled with Avapro and amlodipine.  He does experience some swelling in his legs  that has been manageable.   Experiences swelling in his wrists in the morning.  Stiffness lasts while.   ROS   Current Outpatient Medications:    amLODipine (NORVASC) 10 MG tablet, TAKE 1 TABLET BY MOUTH ONCE DAILY. **APPOINTMENT REQUIRED FOR FUTURE REFILLS.**, Disp: 90 tablet, Rfl: 1   irbesartan (AVAPRO) 300 MG tablet, Take 1 tablet by mouth once daily, Disp: 90 tablet, Rfl: 0   Objective:     BP 132/86 (BP Location: Left Arm, Patient Position: Sitting, Cuff Size: Large)   Pulse 91   Temp 97.9 F (36.6 C) (Temporal)   Ht 5\' 11"  (1.803 m)   Wt 267 lb (121.1 kg)   SpO2 96%   BMI 37.24 kg/m    Physical Exam   No results found for any visits on 05/07/22.    The 10-year ASCVD risk score (Arnett DK, et al., 2019) is: 4.7%    Assessment & Plan:   Healthcare maintenance -     Hemoglobin A1c -     Lipid panel -     Urinalysis, Routine w reflex microscopic  BMI 37.0-37.9, adult  Vasectomy status -     Ambulatory referral to Urology  Facial dermatitis -     Ambulatory referral to  Dermatology  Essential hypertension -     CBC -     Comprehensive metabolic panel  Arthritis -     Rheumatoid factor -     Sedimentation rate -     C-reactive protein    Return in about 1 year (around 05/07/2023).  Information was given on health maintenance and disease prevention.  Information was also given on calorie counting to lose weight.  Discussed his hemoglobin A1c being being in the prediabetic range.  We discussed using GLP-1 agonist.  Libby Maw, MD

## 2022-05-08 ENCOUNTER — Encounter: Payer: Self-pay | Admitting: Family Medicine

## 2022-05-08 LAB — RHEUMATOID FACTOR: Rheumatoid fact SerPl-aCnc: <14 IU/mL (ref <14)

## 2022-05-21 ENCOUNTER — Encounter: Payer: Self-pay | Admitting: *Deleted

## 2022-05-24 ENCOUNTER — Encounter: Payer: Self-pay | Admitting: Urology

## 2022-05-24 ENCOUNTER — Ambulatory Visit: Payer: 59 | Admitting: Urology

## 2022-05-24 VITALS — BP 157/103 | HR 87 | Ht 71.5 in | Wt 255.0 lb

## 2022-05-24 DIAGNOSIS — Z3009 Encounter for other general counseling and advice on contraception: Secondary | ICD-10-CM

## 2022-05-24 NOTE — Progress Notes (Signed)
Assessment: 1. Encounter for vasectomy assessment     Plan: Schedule for vasectomy per patient request Rx for alprazolam 1 mg pre-procedure provided.  Chief Complaint:  Chief Complaint  Patient presents with   VAS Consult    History of Present Illness:  Corey [name] is a 47 y.o. male who is seen for vasectomy evaluation. He is married with 2 children.  His wife is 76. No history of scrotal trauma or infection.  Past Medical History:  Past Medical History:  Diagnosis Date   Hypertension    Kidney stones     Past Surgical History:  Past Surgical History:  Procedure Laterality Date   COLONOSCOPY  05/22/2019   ESOPHAGOGASTRODUODENOSCOPY     over 25 years ago. Had a stomach ulcer and possibly dx of barretts esophagus   KNEE SURGERY Right    x2   LITHOTRIPSY     x2-3    UPPER GASTROINTESTINAL ENDOSCOPY  05/22/2019    Allergies:  Allergies  Allergen Reactions   Hydrochlorothiazide Other (See Comments)    Sun Sensitivity Sweating    Ace Inhibitors Other (See Comments)    Other reaction(s): Cough (ALLERGY/intolerance)    Morphine And Related Hives and Itching    Family History:  Family History  Problem Relation Age of Onset   Arthritis Mother    Hypertension Mother    Hypertension Father    Kidney cancer Father        died 56    Asthma Sister    Asthma Sister    Colon cancer Paternal Aunt        died 61   Esophageal cancer Neg Hx    Colon polyps Neg Hx    Rectal cancer Neg Hx    Stomach cancer Neg Hx     Social History:  Social History   Tobacco Use   Smoking status: Never   Smokeless tobacco: Never  Vaping Use   Vaping Use: Never used  Substance Use Topics   Alcohol use: No   Drug use: No    Review of symptoms:  Constitutional:  Negative for unexplained weight loss, night sweats, fever, chills ENT:  Negative for nose bleeds, sinus pain, painful swallowing CV:  Negative for chest pain, shortness of breath, exercise intolerance,  palpitations, loss of consciousness Resp:  Negative for cough, wheezing, shortness of breath GI:  Negative for nausea, vomiting, diarrhea, bloody stools GU:  Positives noted in HPI; otherwise negative for gross hematuria, dysuria, urinary incontinence Neuro:  Negative for seizures, poor balance, limb weakness, slurred speech Psych:  Negative for lack of energy, depression, anxiety Endocrine:  Negative for polydipsia, polyuria, symptoms of hypoglycemia (dizziness, hunger, sweating) Hematologic:  Negative for anemia, purpura, petechia, prolonged or excessive bleeding, use of anticoagulants  Allergic:  Negative for difficulty breathing or choking as a result of exposure to anything; no shellfish allergy; no allergic response (rash/itch) to materials, foods  Physical exam: BP (!) 143/94   Pulse 75   Ht 5' 11.5" (1.816 m)   Wt 255 lb (115.7 kg)   BMI 35.07 kg/m  GENERAL APPEARANCE:  Well appearing, well developed, well nourished, NAD HEENT:  Atraumatic, normocephalic, oropharynx clear NECK:  Supple without lymphadenopathy or thyromegaly ABDOMEN:  Soft, non-tender, no masses EXTREMITIES:  Moves all extremities well, without clubbing, cyanosis, or edema NEUROLOGIC:  Alert and oriented x 3, normal gait, CN II-XII grossly intact MENTAL STATUS:  appropriate BACK:  Non-tender to palpation, No CVAT SKIN:  Warm, dry, and intact GU:  Penis:  circumcised Meatus: Normal Scrotum: Thick cords bilaterally; vas palpated bilaterally Testis: normal without masses bilateral Epididymis: normal  Results: None  VASECTOMY CONSULTATION  Corey [name] presents for vasectomy consultation today.  He is a 47 y.o. male, Married with 2  children .  He and his wife have discussed the issues regarding long-term fertility and are comfortable with this decision.  He presents for consideration for vasectomy.  I discussed the issues in detail with him today and he expressed no reservations.  As to the procedure, no  scalpel technique vasectomy is explained and reviewed in detail.  Generalized risks including but not limited to bleeding, infection, orchalgia, testicular atrophy, epididymitis, scrotal hematoma, and chronic pain are discussed.   Additionally, he understands that the possibility of vas recanalization following vasectomy is possible although rare.  Most importantly, the patient understands that he is not sterile initially and will need a semen analysis check to confirm sterility such that no sperm are seen.  He is advised to avoid ejaculation for 10 days following the procedure.  The initial semen analysis will be checked in approximately 12 weeks and in some patients, several months may be required for clearance of all sperm.  He reports a clear understanding of the need for continued birth control until sterility is confirmed.  Otherwise, general issues regarding local anesthesia, prep, alprazolam are discussed and he reports a clear understanding.

## 2022-05-24 NOTE — Patient Instructions (Signed)

## 2022-05-28 ENCOUNTER — Telehealth: Payer: Self-pay | Admitting: Urology

## 2022-05-28 NOTE — Telephone Encounter (Signed)
Patient has been scheduled for a Vasectomy procedure with Dr Pete Glatter on 07/26/2022.

## 2022-06-16 ENCOUNTER — Other Ambulatory Visit: Payer: Self-pay | Admitting: Family Medicine

## 2022-06-16 DIAGNOSIS — I10 Essential (primary) hypertension: Secondary | ICD-10-CM

## 2022-07-03 ENCOUNTER — Ambulatory Visit: Payer: 59 | Admitting: Family Medicine

## 2022-07-03 ENCOUNTER — Encounter: Payer: Self-pay | Admitting: Family Medicine

## 2022-07-03 ENCOUNTER — Telehealth: Payer: Self-pay | Admitting: Family Medicine

## 2022-07-03 VITALS — BP 158/98 | HR 88 | Temp 98.1°F | Ht 71.0 in | Wt 254.0 lb

## 2022-07-03 DIAGNOSIS — I10 Essential (primary) hypertension: Secondary | ICD-10-CM

## 2022-07-03 DIAGNOSIS — J0100 Acute maxillary sinusitis, unspecified: Secondary | ICD-10-CM

## 2022-07-03 DIAGNOSIS — R051 Acute cough: Secondary | ICD-10-CM | POA: Diagnosis not present

## 2022-07-03 MED ORDER — AMOXICILLIN 875 MG PO TABS
875.0000 mg | ORAL_TABLET | Freq: Two times a day (BID) | ORAL | 0 refills | Status: AC
Start: 2022-07-03 — End: 2022-07-13

## 2022-07-03 MED ORDER — PROMETHAZINE-DM 6.25-15 MG/5ML PO SYRP
5.0000 mL | ORAL_SOLUTION | Freq: Four times a day (QID) | ORAL | 0 refills | Status: DC | PRN
Start: 2022-07-03 — End: 2023-03-26

## 2022-07-03 MED ORDER — CARVEDILOL 6.25 MG PO TABS
6.2500 mg | ORAL_TABLET | Freq: Two times a day (BID) | ORAL | 3 refills | Status: DC
Start: 2022-07-03 — End: 2022-09-19

## 2022-07-03 NOTE — Progress Notes (Signed)
Established Patient Office Visit   Subjective:  Patient ID: Corey [name], male    DOB: 04-Aug-1975  Age: 47 y.o. MRN: [id number]  Chief Complaint  Patient presents with   Cough    Cough, sore throat, sinus pressure blood in mucus last night symptoms x 3 weeks.     Cough Associated symptoms include a sore throat. Pertinent negatives include no eye redness, myalgias, rash, shortness of breath or wheezing.   Encounter Diagnoses  Name Primary?   Hypertension, uncontrolled Yes   Acute non-recurrent maxillary sinusitis    Acute cough    3-week history of URI symptoms signs and symptoms.  Now with facial pressure over the maxillary sinuses, purulent rhinorrhea and postnasal drip.  PM cough.  No difficulty breathing or wheezing.  No fevers or chills.  Blood pressure remains elevated.  Has been losing weight.  Continues with irbesartan and amlodipine.  Sun sensitivity reaction with HCTZ.  Vasectomy scheduled for the 20th of next month.   Review of Systems  Constitutional: Negative.   HENT:  Positive for congestion, sinus pain and sore throat.   Eyes:  Negative for blurred vision, discharge and redness.  Respiratory:  Positive for cough. Negative for shortness of breath and wheezing.   Cardiovascular: Negative.   Gastrointestinal:  Negative for abdominal pain.  Genitourinary: Negative.   Musculoskeletal: Negative.  Negative for myalgias.  Skin:  Negative for rash.  Neurological:  Negative for tingling, loss of consciousness and weakness.  Endo/Heme/Allergies:  Negative for polydipsia.     Current Outpatient Medications:    amLODipine (NORVASC) 10 MG tablet, TAKE 1 TABLET BY MOUTH ONCE DAILY. **APPOINTMENT REQUIRED FOR FUTURE REFILLS.**, Disp: 90 tablet, Rfl: 1   amoxicillin (AMOXIL) 875 MG tablet, Take 1 tablet (875 mg total) by mouth 2 (two) times daily for 10 days., Disp: 20 tablet, Rfl: 0   carvedilol (COREG) 6.25 MG tablet, Take 1 tablet (6.25 mg total) by mouth 2 (two) times  daily with a meal., Disp: 60 tablet, Rfl: 3   irbesartan (AVAPRO) 300 MG tablet, Take 1 tablet by mouth once daily, Disp: 90 tablet, Rfl: 3   promethazine-dextromethorphan (PROMETHAZINE-DM) 6.25-15 MG/5ML syrup, Take 5 mLs by mouth 4 (four) times daily as needed for cough. Drowsy precautions!, Disp: 118 mL, Rfl: 0   Objective:     BP (!) 162/98 (BP Location: Left Arm, Patient Position: Sitting, Cuff Size: Large)   Pulse 88   Temp 98.1 F (36.7 C) (Temporal)   Ht 5\' 11"  (1.803 m)   Wt 254 lb (115.2 kg)   SpO2 96%   BMI 35.43 kg/m  BP Readings from Last 3 Encounters:  07/03/22 (!) 162/98  05/24/22 (!) 157/103  05/07/22 132/86   Wt Readings from Last 3 Encounters:  07/03/22 254 lb (115.2 kg)  05/24/22 255 lb (115.7 kg)  05/07/22 267 lb (121.1 kg)      Physical Exam Constitutional:      General: He is not in acute distress.    Appearance: Normal appearance. He is not ill-appearing, toxic-appearing or diaphoretic.  HENT:     Head: Normocephalic and atraumatic.     Right Ear: Tympanic membrane, ear canal and external ear normal.     Left Ear: Tympanic membrane, ear canal and external ear normal.     Mouth/Throat:     Mouth: Mucous membranes are moist.     Pharynx: Oropharynx is clear. No oropharyngeal exudate or posterior oropharyngeal erythema.  Eyes:     General: No  scleral icterus.       Right eye: No discharge.        Left eye: No discharge.     Extraocular Movements: Extraocular movements intact.     Conjunctiva/sclera: Conjunctivae normal.     Pupils: Pupils are equal, round, and reactive to light.  Cardiovascular:     Rate and Rhythm: Normal rate and regular rhythm.  Pulmonary:     Effort: Pulmonary effort is normal. No respiratory distress.     Breath sounds: Normal breath sounds. No wheezing, rhonchi or rales.  Abdominal:     General: Bowel sounds are normal.  Musculoskeletal:     Cervical back: No rigidity or tenderness.  Lymphadenopathy:     Cervical: No  cervical adenopathy.  Skin:    General: Skin is warm and dry.  Neurological:     Mental Status: He is alert and oriented to person, place, and time.  Psychiatric:        Mood and Affect: Mood normal.        Behavior: Behavior normal.      No results found for any visits on 07/03/22.    The 10-year ASCVD risk score (Arnett DK, et al., 2019) is: 7.3%    Assessment & Plan:   Hypertension, uncontrolled -     Carvedilol; Take 1 tablet (6.25 mg total) by mouth 2 (two) times daily with a meal.  Dispense: 60 tablet; Refill: 3  Acute non-recurrent maxillary sinusitis -     Amoxicillin; Take 1 tablet (875 mg total) by mouth 2 (two) times daily for 10 days.  Dispense: 20 tablet; Refill: 0  Acute cough -     Promethazine-DM; Take 5 mLs by mouth 4 (four) times daily as needed for cough. Drowsy precautions!  Dispense: 118 mL; Refill: 0    Return in about 8 weeks (around 08/28/2022), or Continue irbesartan and amlodipine.  Have added carvedilol.  Continue irbesartan and amlodipine.  Amoxicillin 875 twice daily for 10 days.  Phenergan DM cough syrup every 6 as needed drowsy precautions.  Mliss Sax, MD

## 2022-07-03 NOTE — Telephone Encounter (Signed)
Caller Name: Raequan Quartuccio Ph #: [phone] Chief Complaint: 2 week cold turning to chest pain and blood in mucus.   This call was transferred to Nurse Triage/Access Nurse. This is for documentation purposes. No follow up required at this time.

## 2022-07-11 ENCOUNTER — Telehealth: Payer: Self-pay | Admitting: Urology

## 2022-07-18 ENCOUNTER — Other Ambulatory Visit: Payer: Self-pay | Admitting: Family Medicine

## 2022-07-18 DIAGNOSIS — I10 Essential (primary) hypertension: Secondary | ICD-10-CM

## 2022-07-26 ENCOUNTER — Encounter: Payer: 59 | Admitting: Urology

## 2022-08-03 ENCOUNTER — Telehealth: Payer: Self-pay

## 2022-08-03 NOTE — Telephone Encounter (Signed)
Pt's wife called inquiring about the one time dose of alprazolam being prescribed prior to pt's vasectomy on Monday. Wife states no Rx was sent in. He would like it sent to the [facility] in [place].

## 2022-08-05 ENCOUNTER — Other Ambulatory Visit: Payer: Self-pay | Admitting: Urology

## 2022-08-05 MED ORDER — ALPRAZOLAM 1 MG PO TABS: ORAL_TABLET | ORAL | 0 refills | Status: AC

## 2022-08-06 ENCOUNTER — Encounter: Payer: Self-pay | Admitting: Urology

## 2022-08-06 ENCOUNTER — Ambulatory Visit: Payer: 59 | Admitting: Urology

## 2022-08-06 VITALS — BP 152/106 | HR 90 | Ht 72.0 in | Wt 250.0 lb

## 2022-08-06 DIAGNOSIS — Z302 Encounter for sterilization: Secondary | ICD-10-CM

## 2022-08-06 MED ORDER — CEPHALEXIN 500 MG PO CAPS
500.0000 mg | ORAL_CAPSULE | Freq: Three times a day (TID) | ORAL | 0 refills | Status: AC
Start: 2022-08-06 — End: 2022-08-09

## 2022-08-06 MED ORDER — HYDROCODONE-ACETAMINOPHEN 5-325 MG PO TABS
1.0000 | ORAL_TABLET | Freq: Four times a day (QID) | ORAL | 0 refills | Status: AC | PRN
Start: 2022-08-06 — End: ?

## 2022-08-06 NOTE — Patient Instructions (Signed)

## 2022-08-06 NOTE — Progress Notes (Signed)
Assessment: 1. Encounter for vasectomy     Plan: Post vasectomy instructions given Rx sent Post vasectomy semen analysis in 12 weeks  Chief Complaint:  Chief Complaint  Patient presents with   VAS    History of Present Illness:  Corey [name] is a 47 y.o. male who is seen for vasectomy. He is married with 2 children.  His wife is 56. No history of scrotal trauma or infection.  Past Medical History:  Past Medical History:  Diagnosis Date   Hypertension    Kidney stones     Past Surgical History:  Past Surgical History:  Procedure Laterality Date   COLONOSCOPY  05/22/2019   ESOPHAGOGASTRODUODENOSCOPY     over 25 years ago. Had a stomach ulcer and possibly dx of barretts esophagus   KNEE SURGERY Right    x2   LITHOTRIPSY     x2-3    UPPER GASTROINTESTINAL ENDOSCOPY  05/22/2019    Allergies:  Allergies  Allergen Reactions   Ace Inhibitors Other (See Comments) and Cough    Other reaction(s): Cough (ALLERGY/intolerance)   Morphine And Codeine Hives and Itching   Hydrochlorothiazide Other (See Comments)    Sun Sensitivity Sweating     Family History:  Family History  Problem Relation Age of Onset   Arthritis Mother    Hypertension Mother    Hypertension Father    Kidney cancer Father        died 26    Asthma Sister    Asthma Sister    Colon cancer Paternal Aunt        died 35   Esophageal cancer Neg Hx    Colon polyps Neg Hx    Rectal cancer Neg Hx    Stomach cancer Neg Hx     Social History:  Social History   Tobacco Use   Smoking status: Never   Smokeless tobacco: Never  Vaping Use   Vaping Use: Never used  Substance Use Topics   Alcohol use: No   Drug use: No    ROS: Constitutional:  Negative for fever, chills, weight loss CV: Negative for chest pain, previous MI, hypertension Respiratory:  Negative for shortness of breath, wheezing, sleep apnea, frequent cough GI:  Negative for nausea, vomiting, bloody stool, GERD  Physical  exam: BP (!) 152/106   Pulse 90   Ht 6' (1.829 m)   Wt 250 lb (113.4 kg)   BMI 33.91 kg/m  GENERAL APPEARANCE:  Well appearing, well developed, well nourished, NAD HEENT:  Atraumatic, normocephalic, oropharynx clear NECK:  Supple without lymphadenopathy or thyromegaly ABDOMEN:  Soft, non-tender, no masses EXTREMITIES:  Moves all extremities well, without clubbing, cyanosis, or edema NEUROLOGIC:  Alert and oriented x 3, normal gait, CN II-XII grossly intact MENTAL STATUS:  appropriate BACK:  Non-tender to palpation, No CVAT SKIN:  Warm, dry, and intact  Results: None  VASECTOMY PROCEDURE:  Corey [name] presents for vasectomy following previous vasectomy consultation and permit is signed.  The patient's anterior scrotal wall is shaved and prepped with Betadine in standard sterile fashion.  1% lidocaine is used as local anesthetic in the scrotal and peri vasal tissue.  A standard median raphe punch incision is made and a no scalpel technique vasectomy is performed.  Bilateral vas are isolated from the peri vasal tissue and an approximately 1 cm segment of vas is excised.  Proximal and distal segments are internally cauterized with electric heat cautery. Interposition of perivasal tissue was performed.  Bilateral palpation  confirms bilateral vasectomy defect and no significant bleeding or hematoma is identified.  Neosporin gauze dressing and a scrotal support are applied.    Disposition: Patient is discharged home with Rx for pain medication and antibiotics.  Patient is given routine vasectomy instructions.   Most importantly, he is instructed and cautioned again regarding the need for protected intercourse until such time that a single  negative semen analysis has been obtained.  The initial semen analysis will be checked in approximately 12  weeks.  The patient reports a clear understanding.  He will call with any interval questions or concerns.

## 2022-08-08 ENCOUNTER — Encounter: Payer: 59 | Admitting: Urology

## 2022-09-19 ENCOUNTER — Ambulatory Visit (INDEPENDENT_AMBULATORY_CARE_PROVIDER_SITE_OTHER): Payer: 59 | Admitting: Family Medicine

## 2022-09-19 ENCOUNTER — Encounter: Payer: Self-pay | Admitting: Family Medicine

## 2022-09-19 VITALS — BP 164/102 | HR 85 | Temp 98.3°F | Ht 72.0 in | Wt 260.0 lb

## 2022-09-19 DIAGNOSIS — I10 Essential (primary) hypertension: Secondary | ICD-10-CM

## 2022-09-19 DIAGNOSIS — K649 Unspecified hemorrhoids: Secondary | ICD-10-CM | POA: Diagnosis not present

## 2022-09-19 MED ORDER — CARVEDILOL 12.5 MG PO TABS
12.5000 mg | ORAL_TABLET | Freq: Two times a day (BID) | ORAL | 3 refills | Status: DC
Start: 2022-09-19 — End: 2023-02-12

## 2022-09-19 MED ORDER — DOCUSATE SODIUM 100 MG PO CAPS
100.0000 mg | ORAL_CAPSULE | Freq: Two times a day (BID) | ORAL | 0 refills | Status: AC
Start: 2022-09-19 — End: ?

## 2022-09-19 MED ORDER — HYDROCORTISONE ACETATE 25 MG RE SUPP
25.0000 mg | Freq: Two times a day (BID) | RECTAL | 0 refills | Status: AC
Start: 2022-09-19 — End: ?

## 2022-09-19 NOTE — Progress Notes (Signed)
Established Patient Office Visit   Subjective:  Patient ID: Corey [name], male    DOB: 11-07-1975  Age: 47 y.o. MRN: [id number]  Chief Complaint  Patient presents with   Hemorrhoids    External hemorrhaod pain x 1 day. No constipation or bleeding.     HPI Encounter Diagnoses  Name Primary?   Hypertension, uncontrolled Yes   Hemorrhoids, unspecified hemorrhoid type    Standing history of external hemorrhoids.  Comes in today with 1 that has recently become swollen and tender.  Fortunately, over the last day or so it is decreased in size and then less tender.  Denies prolonged toilet bowl sitting or constipation.  Compliant with all of his blood pressure medications weight is increased since last visit.  Status post vasectomy.  Second child is 7 months.   Review of Systems  Constitutional: Negative.   HENT: Negative.    Eyes:  Negative for blurred vision, discharge and redness.  Respiratory: Negative.    Cardiovascular: Negative.   Gastrointestinal:  Negative for abdominal pain and constipation.  Genitourinary: Negative.   Musculoskeletal: Negative.  Negative for myalgias.  Skin:  Negative for rash.  Neurological:  Negative for tingling, loss of consciousness and weakness.  Endo/Heme/Allergies:  Negative for polydipsia.     Current Outpatient Medications:    amLODipine (NORVASC) 10 MG tablet, Take 1 tablet by mouth once daily, Disp: 90 tablet, Rfl: 0   carvedilol (COREG) 12.5 MG tablet, Take 1 tablet (12.5 mg total) by mouth 2 (two) times daily with a meal., Disp: 60 tablet, Rfl: 3   docusate sodium (COLACE) 100 MG capsule, Take 1 capsule (100 mg total) by mouth 2 (two) times daily., Disp: 50 capsule, Rfl: 0   HYDROcodone-acetaminophen (NORCO/VICODIN) 5-325 MG tablet, Take 1 tablet by mouth every 6 (six) hours as needed for moderate pain., Disp: 8 tablet, Rfl: 0   hydrocortisone (ANUSOL-HC) 25 MG suppository, Place 1 suppository (25 mg total) rectally 2 (two) times daily.,  Disp: 12 suppository, Rfl: 0   irbesartan (AVAPRO) 300 MG tablet, Take 1 tablet by mouth once daily, Disp: 90 tablet, Rfl: 3   ALPRAZolam (XANAX) 1 MG tablet, Take 1 tablet by mouth 1 hour prior to procedure (Patient not taking: Reported on 09/19/2022), Disp: 1 tablet, Rfl: 0   promethazine-dextromethorphan (PROMETHAZINE-DM) 6.25-15 MG/5ML syrup, Take 5 mLs by mouth 4 (four) times daily as needed for cough. Drowsy precautions! (Patient not taking: Reported on 09/19/2022), Disp: 118 mL, Rfl: 0   Objective:     BP (!) 164/102   Pulse 85   Temp 98.3 F (36.8 C)   Ht 6' (1.829 m)   Wt 260 lb (117.9 kg)   SpO2 98%   BMI 35.26 kg/m  BP Readings from Last 3 Encounters:  09/19/22 (!) 164/102  08/06/22 (!) 152/106  07/03/22 (!) 158/98   Wt Readings from Last 3 Encounters:  09/19/22 260 lb (117.9 kg)  08/06/22 250 lb (113.4 kg)  07/03/22 254 lb (115.2 kg)      Physical Exam Constitutional:      General: He is not in acute distress.    Appearance: Normal appearance. He is not ill-appearing, toxic-appearing or diaphoretic.  HENT:     Head: Normocephalic and atraumatic.     Right Ear: External ear normal.     Left Ear: External ear normal.  Eyes:     General: No scleral icterus.       Right eye: No discharge.  Left eye: No discharge.     Extraocular Movements: Extraocular movements intact.     Conjunctiva/sclera: Conjunctivae normal.  Pulmonary:     Effort: Pulmonary effort is normal. No respiratory distress.  Genitourinary:    Rectum: External hemorrhoid present.    Skin:    General: Skin is warm and dry.  Neurological:     Mental Status: He is alert and oriented to person, place, and time.  Psychiatric:        Mood and Affect: Mood normal.        Behavior: Behavior normal.      No results found for any visits on 09/19/22.    The 10-year ASCVD risk score (Arnett DK, et al., 2019) is: 7.4%    Assessment & Plan:   Hypertension, uncontrolled -      Carvedilol; Take 1 tablet (12.5 mg total) by mouth 2 (two) times daily with a meal.  Dispense: 60 tablet; Refill: 3  Hemorrhoids, unspecified hemorrhoid type -     Hydrocortisone Acetate; Place 1 suppository (25 mg total) rectally 2 (two) times daily.  Dispense: 12 suppository; Refill: 0 -     Docusate Sodium; Take 1 capsule (100 mg total) by mouth 2 (two) times daily.  Dispense: 50 capsule; Refill: 0    Return in about 1 week (around 09/26/2022), or soak in a warm tub or use sitz bath three times daily..  Have increased carvedilol to 25 mg twice daily.  Patient will place the hydrocortisone suppositories twice daily.  Sitz bath's 3 times daily.  Clean with Tucks pads or cotton pad septin which hazel after stooling.  Use Colace twice daily.  Mliss Sax, MD

## 2022-09-25 ENCOUNTER — Encounter: Payer: Self-pay | Admitting: Gastroenterology

## 2022-09-25 ENCOUNTER — Encounter: Payer: Self-pay | Admitting: Family Medicine

## 2022-09-25 DIAGNOSIS — K649 Unspecified hemorrhoids: Secondary | ICD-10-CM

## 2022-09-27 ENCOUNTER — Ambulatory Visit: Payer: 59 | Admitting: Family Medicine

## 2022-09-27 ENCOUNTER — Encounter: Payer: Self-pay | Admitting: Family Medicine

## 2022-09-27 VITALS — BP 142/100 | HR 68 | Temp 97.3°F | Ht 72.0 in | Wt 257.6 lb

## 2022-09-27 DIAGNOSIS — K649 Unspecified hemorrhoids: Secondary | ICD-10-CM

## 2022-09-27 DIAGNOSIS — I10 Essential (primary) hypertension: Secondary | ICD-10-CM

## 2022-09-27 NOTE — Progress Notes (Signed)
Established Patient Office Visit   Subjective:  Patient ID: Corey [name], male    DOB: [birth year]  Age: 47 y.o. MRN: [id number]  Chief Complaint  Patient presents with   Hemorrhoids    1 week follow up. Pt states pain has not improved much. Pain got worse before it got a little better.     HPI Encounter Diagnoses  Name Primary?   Hemorrhoids, unspecified hemorrhoid type Yes   Hypertension, uncontrolled    Blood pressure has come down with the addition of carvedilol.  Patient is trying to lose weight.  No issues taking the carvedilol.  Hemorrhoid has been worse and then better but is about the same after conservative treatment outlined in previous exam.   Review of Systems  Constitutional: Negative.   HENT: Negative.    Eyes:  Negative for blurred vision, discharge and redness.  Respiratory: Negative.    Cardiovascular: Negative.   Gastrointestinal:  Negative for abdominal pain.  Genitourinary: Negative.   Musculoskeletal: Negative.  Negative for myalgias.  Skin:  Negative for rash.  Neurological:  Negative for tingling, loss of consciousness and weakness.  Endo/Heme/Allergies:  Negative for polydipsia.     Current Outpatient Medications:    ALPRAZolam (XANAX) 1 MG tablet, Take 1 tablet by mouth 1 hour prior to procedure, Disp: 1 tablet, Rfl: 0   amLODipine (NORVASC) 10 MG tablet, Take 1 tablet by mouth once daily, Disp: 90 tablet, Rfl: 0   carvedilol (COREG) 12.5 MG tablet, Take 1 tablet (12.5 mg total) by mouth 2 (two) times daily with a meal., Disp: 60 tablet, Rfl: 3   docusate sodium (COLACE) 100 MG capsule, Take 1 capsule (100 mg total) by mouth 2 (two) times daily., Disp: 50 capsule, Rfl: 0   HYDROcodone-acetaminophen (NORCO/VICODIN) 5-325 MG tablet, Take 1 tablet by mouth every 6 (six) hours as needed for moderate pain., Disp: 8 tablet, Rfl: 0   hydrocortisone (ANUSOL-HC) 25 MG suppository, Place 1 suppository (25 mg total) rectally 2 (two) times daily., Disp: 12  suppository, Rfl: 0   irbesartan (AVAPRO) 300 MG tablet, Take 1 tablet by mouth once daily, Disp: 90 tablet, Rfl: 3   promethazine-dextromethorphan (PROMETHAZINE-DM) 6.25-15 MG/5ML syrup, Take 5 mLs by mouth 4 (four) times daily as needed for cough. Drowsy precautions!, Disp: 118 mL, Rfl: 0   Objective:     BP (!) 142/100   Pulse 68   Temp (!) 97.3 F (36.3 C)   Ht 6' (1.829 m)   Wt 257 lb 9.6 oz (116.8 kg)   SpO2 97%   BMI 34.94 kg/m    Physical Exam Constitutional:      General: He is not in acute distress.    Appearance: Normal appearance. He is not ill-appearing, toxic-appearing or diaphoretic.  HENT:     Head: Normocephalic and atraumatic.     Right Ear: External ear normal.     Left Ear: External ear normal.  Eyes:     General: No scleral icterus.       Right eye: No discharge.        Left eye: No discharge.     Extraocular Movements: Extraocular movements intact.     Conjunctiva/sclera: Conjunctivae normal.  Pulmonary:     Effort: Pulmonary effort is normal. No respiratory distress.  Genitourinary:   Skin:    General: Skin is warm and dry.  Neurological:     Mental Status: He is alert and oriented to person, place, and time.  Psychiatric:  Mood and Affect: Mood normal.        Behavior: Behavior normal.    Incision Thrombosed Hemorrhoid Procedure Note  Pre-operative Diagnosis: Thrombosed external hemorrhoid  Post-operative Diagnosis: Thrombosed external hemorrhoid  Locations: 3 o'clock position  Indications: Painful external hemorrhoid. Explained surgical vs conservative treatment; surgical incision and drainage of clot usually works quickly to reduce pain and shorten healing time, but is uncomfortable to perform.  After discussion, the patient wishes to proceed with this procedure.  Anesthesia: Lidocaine 1% with epinephrine without added sodium bicarbonate  Procedure Details  After adequate anesthesia, an elliptical incision was made, and the  visible clot was extruded with curved hemostat. This was well tolerated. Bulky dressing is applied.  Complications: none.  Plan: 1. Very frequent Sitz baths. 2. Colace tid prn and increase fluids to prevent constipation.   3. Call or return to clinic prn if these symptoms worsen or fail to improve as anticipated  No results found for any visits on 09/27/22.    The 10-year ASCVD risk score (Arnett DK, et al., 2019) is: 5.8%    Assessment & Plan:   Hemorrhoids, unspecified hemorrhoid type  Hypertension, uncontrolled    Return in about 1 week (around 10/04/2022), or if symptoms worsen or fail to improve.  Information given about aftercare for I&D of hemorrhoid.  Continue all medications for hypertension.  Information given on managing hypertension  Mliss Sax, MD

## 2022-10-04 ENCOUNTER — Ambulatory Visit: Payer: 59 | Admitting: Family Medicine

## 2022-10-04 ENCOUNTER — Encounter: Payer: Self-pay | Admitting: Family Medicine

## 2022-10-04 VITALS — BP 132/90 | HR 58 | Temp 97.7°F | Ht 72.0 in | Wt 253.0 lb

## 2022-10-04 DIAGNOSIS — E669 Obesity, unspecified: Secondary | ICD-10-CM

## 2022-10-04 DIAGNOSIS — K649 Unspecified hemorrhoids: Secondary | ICD-10-CM

## 2022-10-04 DIAGNOSIS — R7303 Prediabetes: Secondary | ICD-10-CM | POA: Diagnosis not present

## 2022-10-04 DIAGNOSIS — I10 Essential (primary) hypertension: Secondary | ICD-10-CM

## 2022-10-04 HISTORY — DX: Prediabetes: R73.03

## 2022-10-04 HISTORY — DX: Unspecified hemorrhoids: K64.9

## 2022-10-04 NOTE — Progress Notes (Signed)
Established Patient Office Visit   Subjective:  Patient ID: Corey [name], male    DOB: 1975-02-10  Age: 47 y.o. MRN: [id number]  Chief Complaint  Patient presents with   Hemorrhoids    1 week follow up. Pt states improvment    HPI Encounter Diagnoses  Name Primary?   Hemorrhoids, unspecified hemorrhoid type Yes   Essential hypertension    Obesity (BMI 30-39.9)    Prediabetes    For follow-up of above.  Blood pressure has been running in the 120s over upper 80s at home.  He is compliant with amlodipine, carvedilol and irbesartan.  No issues taking the medications.  He is mindful of his sodium intake.  Decreased fast food intake.  He is exercising.  He has been able to lose some weight.  His wife does not complain about his snoring.  He does have a stressful job.  Doing quite well after hemorrhoid procedure.  He continues the Colace and is avoiding prolonged toilet bowl setting.   Review of Systems  Constitutional: Negative.   HENT: Negative.    Eyes:  Negative for blurred vision, discharge and redness.  Respiratory: Negative.    Cardiovascular: Negative.   Gastrointestinal:  Negative for abdominal pain.  Genitourinary: Negative.   Musculoskeletal: Negative.  Negative for myalgias.  Skin:  Negative for rash.  Neurological:  Negative for tingling, loss of consciousness and weakness.  Endo/Heme/Allergies:  Negative for polydipsia.     Current Outpatient Medications:    ALPRAZolam (XANAX) 1 MG tablet, Take 1 tablet by mouth 1 hour prior to procedure, Disp: 1 tablet, Rfl: 0   amLODipine (NORVASC) 10 MG tablet, Take 1 tablet by mouth once daily, Disp: 90 tablet, Rfl: 0   carvedilol (COREG) 12.5 MG tablet, Take 1 tablet (12.5 mg total) by mouth 2 (two) times daily with a meal., Disp: 60 tablet, Rfl: 3   docusate sodium (COLACE) 100 MG capsule, Take 1 capsule (100 mg total) by mouth 2 (two) times daily., Disp: 50 capsule, Rfl: 0   HYDROcodone-acetaminophen (NORCO/VICODIN) 5-325 MG  tablet, Take 1 tablet by mouth every 6 (six) hours as needed for moderate pain., Disp: 8 tablet, Rfl: 0   hydrocortisone (ANUSOL-HC) 25 MG suppository, Place 1 suppository (25 mg total) rectally 2 (two) times daily., Disp: 12 suppository, Rfl: 0   irbesartan (AVAPRO) 300 MG tablet, Take 1 tablet by mouth once daily, Disp: 90 tablet, Rfl: 3   promethazine-dextromethorphan (PROMETHAZINE-DM) 6.25-15 MG/5ML syrup, Take 5 mLs by mouth 4 (four) times daily as needed for cough. Drowsy precautions!, Disp: 118 mL, Rfl: 0   Objective:     BP (!) 132/90   Pulse (!) 58   Temp 97.7 F (36.5 C)   Ht 6' (1.829 m)   Wt 253 lb (114.8 kg)   SpO2 98%   BMI 34.31 kg/m  BP Readings from Last 3 Encounters:  10/04/22 (!) 132/90  09/27/22 (!) 142/100  09/19/22 (!) 164/102   Wt Readings from Last 3 Encounters:  10/04/22 253 lb (114.8 kg)  09/27/22 257 lb 9.6 oz (116.8 kg)  09/19/22 260 lb (117.9 kg)      Physical Exam Constitutional:      General: He is not in acute distress.    Appearance: Normal appearance. He is not ill-appearing, toxic-appearing or diaphoretic.  HENT:     Head: Normocephalic and atraumatic.     Right Ear: External ear normal.     Left Ear: External ear normal.  Mouth/Throat:     Mouth: Mucous membranes are moist.     Pharynx: Oropharynx is clear. No oropharyngeal exudate or posterior oropharyngeal erythema.  Eyes:     General: No scleral icterus.       Right eye: No discharge.        Left eye: No discharge.     Extraocular Movements: Extraocular movements intact.     Conjunctiva/sclera: Conjunctivae normal.     Pupils: Pupils are equal, round, and reactive to light.  Cardiovascular:     Rate and Rhythm: Normal rate and regular rhythm.  Pulmonary:     Effort: Pulmonary effort is normal. No respiratory distress.     Breath sounds: Normal breath sounds.  Abdominal:     General: Bowel sounds are normal.     Tenderness: There is no abdominal tenderness. There is no  guarding.  Genitourinary:   Musculoskeletal:     Cervical back: No rigidity or tenderness.  Skin:    General: Skin is warm and dry.  Neurological:     Mental Status: He is alert and oriented to person, place, and time.  Psychiatric:        Mood and Affect: Mood normal.        Behavior: Behavior normal.      No results found for any visits on 10/04/22.    The 10-year ASCVD risk score (Arnett DK, et al., 2019) is: 5.1%    Assessment & Plan:   Hemorrhoids, unspecified hemorrhoid type  Essential hypertension  Obesity (BMI 30-39.9)  Prediabetes    Return in about 3 months (around 01/04/2023).  Patient would like the opportunity to continue weight loss efforts and increasing exercise.  Information was given on managing hypertension as well as an 1800-calorie a day diet.  Information given on prediabetes.  Could consider an apnea evaluation.  We discussed using metformin for prediabetes and weight loss.  He would like to hold off for now.  Mliss Sax, MD

## 2022-10-30 ENCOUNTER — Ambulatory Visit: Payer: 59 | Admitting: Gastroenterology

## 2022-11-06 ENCOUNTER — Other Ambulatory Visit: Payer: 59

## 2022-11-06 DIAGNOSIS — Z302 Encounter for sterilization: Secondary | ICD-10-CM

## 2022-11-07 ENCOUNTER — Other Ambulatory Visit: Payer: Self-pay | Admitting: Family Medicine

## 2022-11-07 ENCOUNTER — Telehealth: Payer: Self-pay | Admitting: Urology

## 2022-11-07 DIAGNOSIS — I10 Essential (primary) hypertension: Secondary | ICD-10-CM

## 2022-11-07 LAB — POST-VAS SPERM EVALUATION,QUAL: Volume: 4.6 mL

## 2022-11-07 NOTE — Telephone Encounter (Signed)
Lab appt yesterday. Wants to know results. Says that he can see them in mychart.

## 2022-11-08 ENCOUNTER — Other Ambulatory Visit: Payer: Self-pay

## 2022-11-08 DIAGNOSIS — Z302 Encounter for sterilization: Secondary | ICD-10-CM

## 2022-11-08 NOTE — Telephone Encounter (Signed)
Ok per [employer], spoke w pts wife, advised her of results. Repeat semen analysis lab appt booked for 4 weeks.

## 2022-12-06 ENCOUNTER — Other Ambulatory Visit: Payer: 59

## 2022-12-12 IMAGING — DX DG SHOULDER 2+V*R*
3 series · 3 of 3 positions shown · non-contrast
Comparison: None.

CLINICAL DATA: Pain.

EXAM:
RIGHT SHOULDER - 2+ VIEW

[shoulder y view]
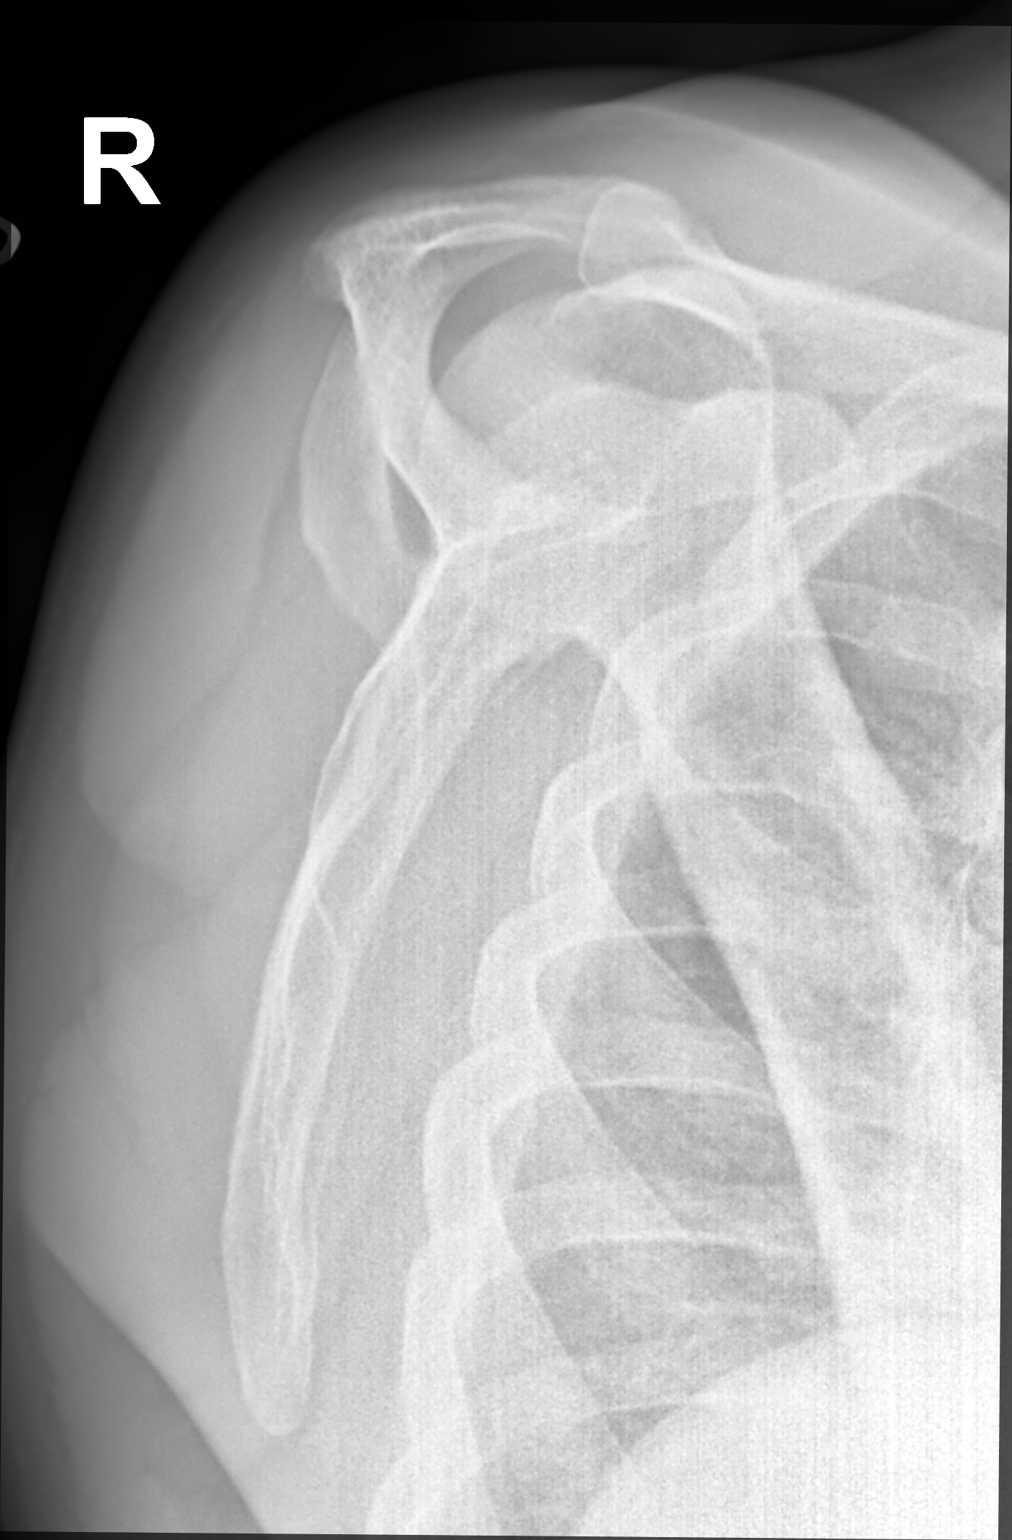

[shoulder (grashey) ap]
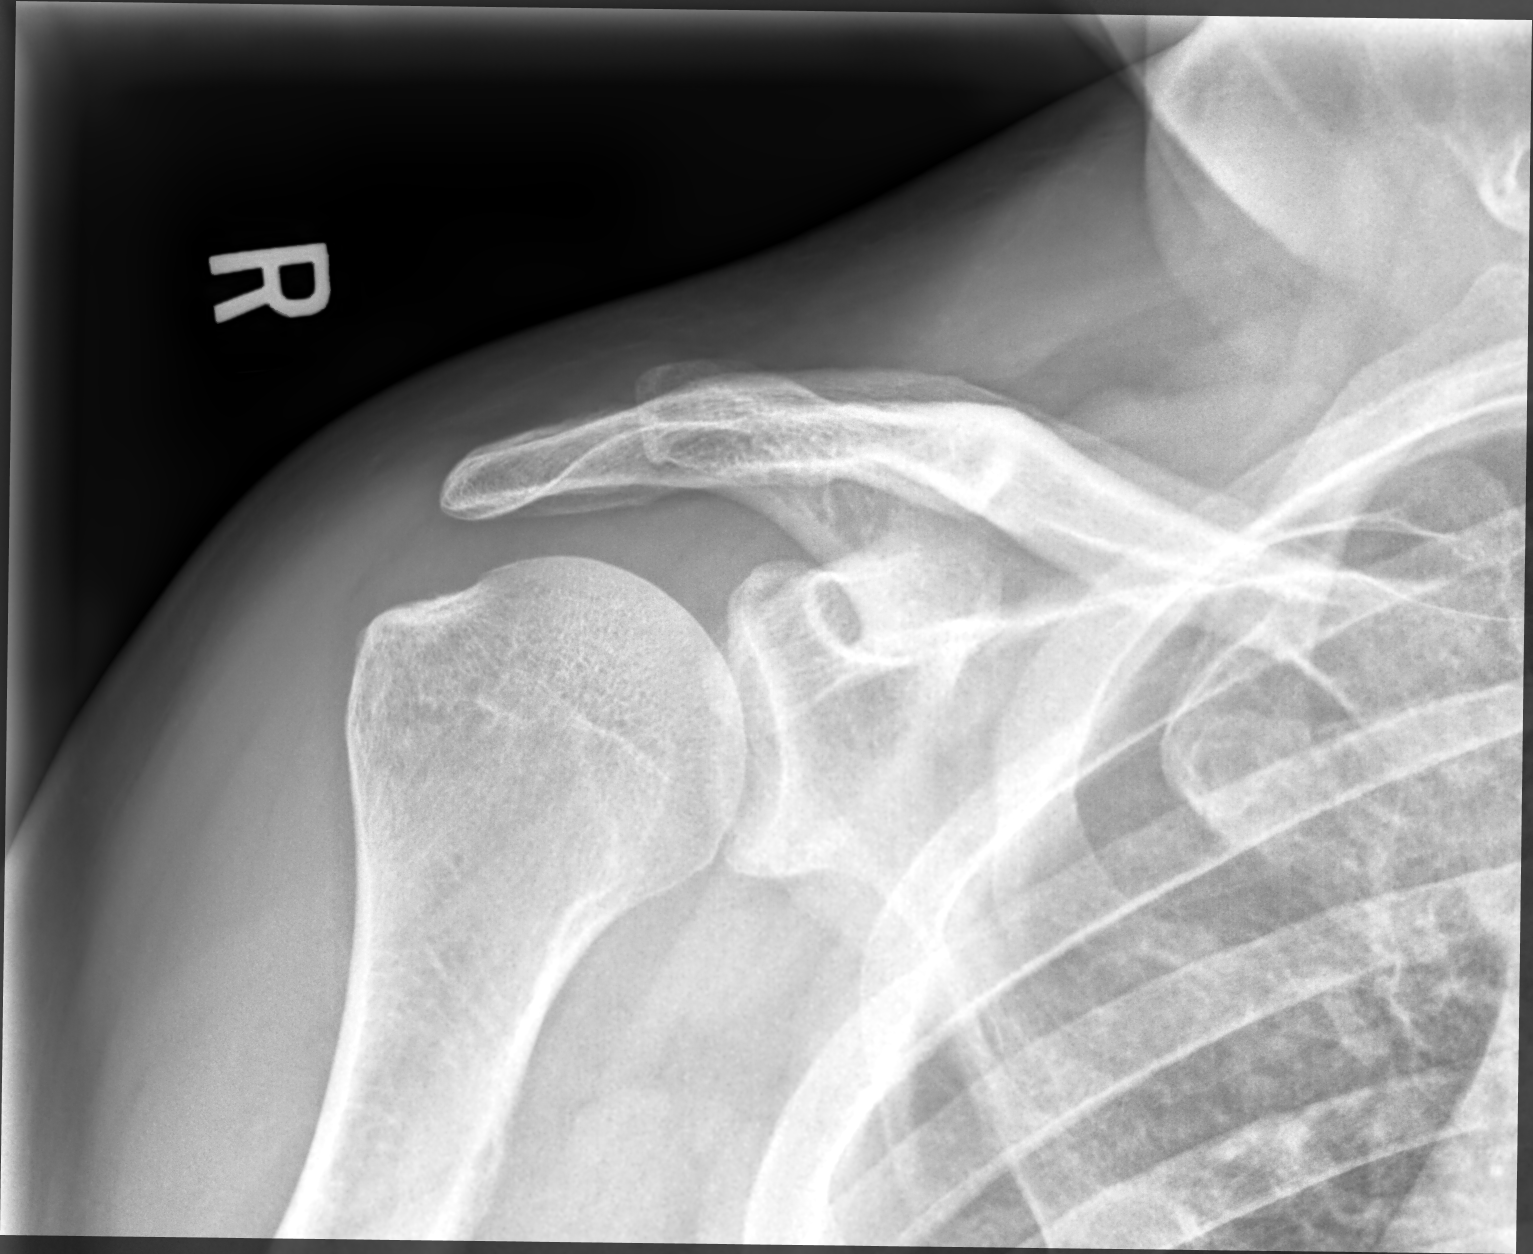

[shoulder axial]
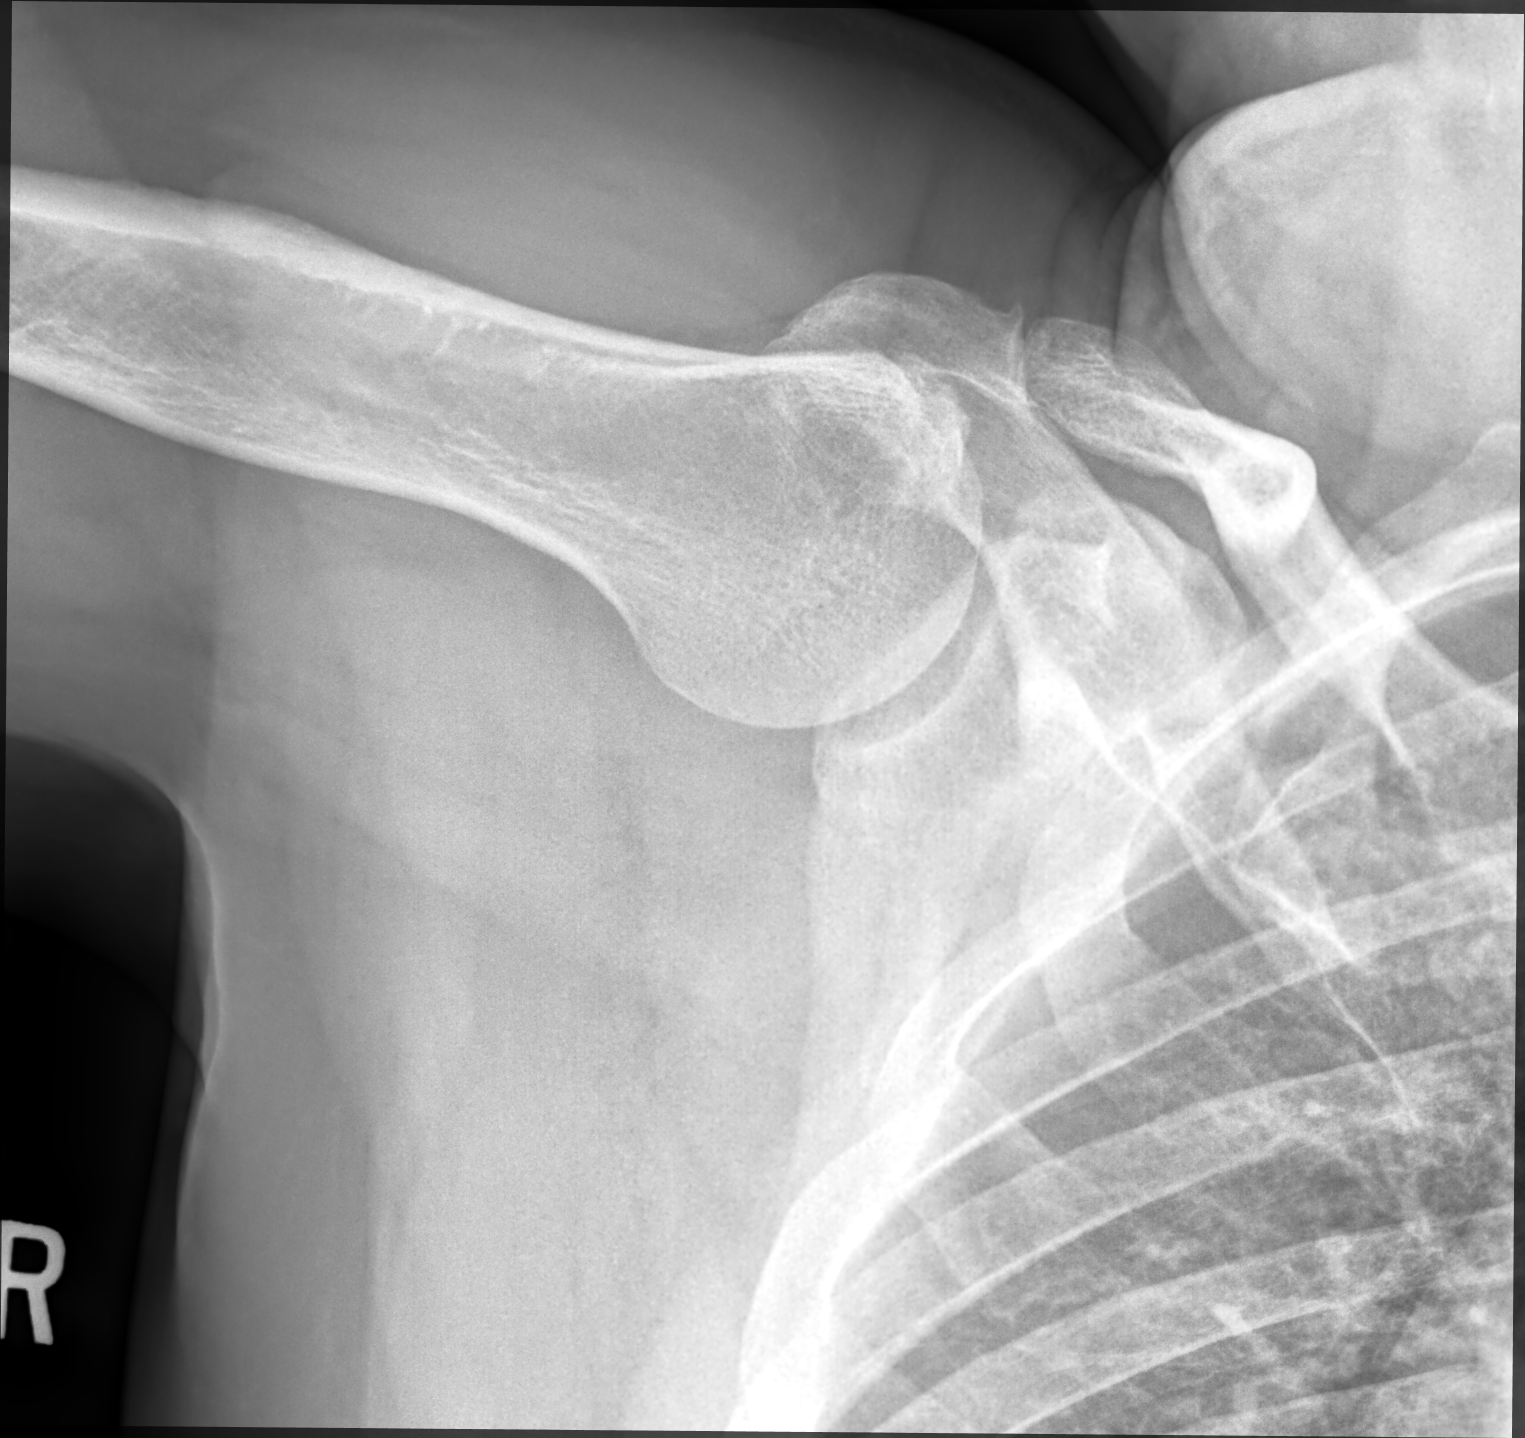

[3 of 3 positions shown; findings below may reference images not displayed]

FINDINGS: There is no evidence of fracture or dislocation. There is no
evidence of arthropathy or other focal bone abnormality. Soft
tissues are unremarkable.
IMPRESSION: Negative.

## 2022-12-13 ENCOUNTER — Other Ambulatory Visit: Payer: Self-pay

## 2022-12-13 ENCOUNTER — Other Ambulatory Visit: Payer: 59

## 2022-12-13 DIAGNOSIS — Z302 Encounter for sterilization: Secondary | ICD-10-CM

## 2022-12-14 LAB — POST-VAS SPERM EVALUATION,QUAL: Volume: 2.7 mL

## 2023-01-07 ENCOUNTER — Ambulatory Visit: Payer: 59 | Admitting: Family Medicine

## 2023-01-15 ENCOUNTER — Ambulatory Visit: Payer: 59 | Admitting: Family Medicine

## 2023-02-08 ENCOUNTER — Other Ambulatory Visit: Payer: Self-pay | Admitting: Family Medicine

## 2023-02-08 DIAGNOSIS — I10 Essential (primary) hypertension: Secondary | ICD-10-CM

## 2023-02-12 ENCOUNTER — Ambulatory Visit: Payer: 59 | Admitting: Family Medicine

## 2023-02-12 ENCOUNTER — Encounter: Payer: Self-pay | Admitting: Family Medicine

## 2023-02-12 VITALS — BP 168/102 | HR 96 | Temp 97.7°F | Ht 72.0 in | Wt 269.8 lb

## 2023-02-12 DIAGNOSIS — R0781 Pleurodynia: Secondary | ICD-10-CM | POA: Diagnosis not present

## 2023-02-12 DIAGNOSIS — Z20828 Contact with and (suspected) exposure to other viral communicable diseases: Secondary | ICD-10-CM | POA: Diagnosis not present

## 2023-02-12 DIAGNOSIS — R000 Tachycardia, unspecified: Secondary | ICD-10-CM | POA: Diagnosis not present

## 2023-02-12 DIAGNOSIS — J0100 Acute maxillary sinusitis, unspecified: Secondary | ICD-10-CM

## 2023-02-12 DIAGNOSIS — I10 Essential (primary) hypertension: Secondary | ICD-10-CM | POA: Diagnosis not present

## 2023-02-12 HISTORY — DX: Contact with and (suspected) exposure to other viral communicable diseases: Z20.828

## 2023-02-12 HISTORY — DX: Pleurodynia: R07.81

## 2023-02-12 HISTORY — DX: Tachycardia, unspecified: R00.0

## 2023-02-12 LAB — BASIC METABOLIC PANEL
BUN: 14 mg/dL (ref 6–23)
CO2: 32 meq/L (ref 19–32)
Calcium: 9.4 mg/dL (ref 8.4–10.5)
Chloride: 102 meq/L (ref 96–112)
Creatinine, Ser: 0.97 mg/dL (ref 0.40–1.50)
GFR: 91.48 mL/min (ref >60.00)
Glucose, Bld: 112 mg/dL — ABNORMAL HIGH (ref 70–99)
Potassium: 4.4 meq/L (ref 3.5–5.1)
Sodium: 141 meq/L (ref 135–145)

## 2023-02-12 LAB — CBC
HCT: 50.2 % (ref 39.0–52.0)
Hemoglobin: 16.8 g/dL (ref 13.0–17.0)
MCHC: 33.5 g/dL (ref 30.0–36.0)
MCV: 88 fL (ref 78.0–100.0)
Platelets: 276 10*3/uL (ref 150.0–400.0)
RBC: 5.71 Mil/uL (ref 4.22–5.81)
RDW: 13.6 % (ref 11.5–15.5)
WBC: 10.1 10*3/uL (ref 4.0–10.5)

## 2023-02-12 LAB — TSH: TSH: 0.93 u[IU]/mL (ref 0.35–5.50)

## 2023-02-12 MED ORDER — OSELTAMIVIR PHOSPHATE 75 MG PO CAPS: 75.0000 mg | ORAL_CAPSULE | Freq: Two times a day (BID) | ORAL | 0 refills | Status: AC

## 2023-02-12 MED ORDER — CARVEDILOL 25 MG PO TABS: 25.0000 mg | ORAL_TABLET | Freq: Two times a day (BID) | ORAL | 3 refills | Status: DC

## 2023-02-12 MED ORDER — AMOXICILLIN 875 MG PO TABS
875.0000 mg | ORAL_TABLET | Freq: Two times a day (BID) | ORAL | 0 refills | Status: AC
Start: 2023-02-12 — End: 2023-02-22

## 2023-02-12 NOTE — Progress Notes (Signed)
 Established Patient Office Visit   Subjective:  Patient ID: Corey [name], male    DOB: 1975/07/26  Age: 48 y.o. MRN: [id number]  No chief complaint on file.   HPI Encounter Diagnoses  Name Primary?   Acute non-recurrent maxillary sinusitis Yes   Pleurodynia    Exposure to the flu    Tachycardia    Hypertension, uncontrolled    1 month history of URI signs and symptoms with ongoing nasal congestion, yellow rhinorrhea, facial pressure with dental discomfort.  Face and head hurt when he bends over to pick something up.  Reports chest pain when leaning forward or lying in the bed.  There is some pain when he takes a deep breath.  Denies fevers chills.  There is no associated nausea vomiting, diaphoresis, shortness of breath or radiation of the pain.  Small children at home recently diagnosed with the flu.  Compliant with all of his blood pressure medicines.  17 pound weight gain since August.   Review of Systems  Constitutional: Negative.  Negative for chills, diaphoresis and fever.  HENT:  Positive for congestion and sinus pain.   Eyes:  Negative for blurred vision, discharge and redness.  Respiratory:  Positive for cough. Negative for sputum production, shortness of breath and wheezing.   Cardiovascular:  Positive for chest pain.  Gastrointestinal:  Negative for abdominal pain, nausea and vomiting.  Genitourinary: Negative.   Musculoskeletal: Negative.  Negative for myalgias.  Skin:  Negative for rash.  Neurological:  Negative for tingling, loss of consciousness and weakness.  Endo/Heme/Allergies:  Negative for polydipsia.     Current Outpatient Medications:    amLODipine  (NORVASC ) 10 MG tablet, Take 1 tablet by mouth once daily, Disp: 90 tablet, Rfl: 0   amoxicillin  (AMOXIL ) 875 MG tablet, Take 1 tablet (875 mg total) by mouth 2 (two) times daily for 10 days., Disp: 20 tablet, Rfl: 0   carvedilol  (COREG ) 25 MG tablet, Take 1 tablet (25 mg total) by mouth 2 (two) times daily  with a meal., Disp: 60 tablet, Rfl: 3   irbesartan  (AVAPRO ) 300 MG tablet, Take 1 tablet by mouth once daily, Disp: 90 tablet, Rfl: 3   oseltamivir  (TAMIFLU ) 75 MG capsule, Take 1 capsule (75 mg total) by mouth 2 (two) times daily for 5 days., Disp: 10 capsule, Rfl: 0   ALPRAZolam  (XANAX ) 1 MG tablet, Take 1 tablet by mouth 1 hour prior to procedure (Patient not taking: Reported on 02/12/2023), Disp: 1 tablet, Rfl: 0   docusate sodium  (COLACE) 100 MG capsule, Take 1 capsule (100 mg total) by mouth 2 (two) times daily. (Patient not taking: Reported on 02/12/2023), Disp: 50 capsule, Rfl: 0   HYDROcodone -acetaminophen  (NORCO/VICODIN) 5-325 MG tablet, Take 1 tablet by mouth every 6 (six) hours as needed for moderate pain. (Patient not taking: Reported on 02/12/2023), Disp: 8 tablet, Rfl: 0   hydrocortisone  (ANUSOL -HC) 25 MG suppository, Place 1 suppository (25 mg total) rectally 2 (two) times daily. (Patient not taking: Reported on 02/12/2023), Disp: 12 suppository, Rfl: 0   promethazine -dextromethorphan (PROMETHAZINE -DM) 6.25-15 MG/5ML syrup, Take 5 mLs by mouth 4 (four) times daily as needed for cough. Drowsy precautions! (Patient not taking: Reported on 02/12/2023), Disp: 118 mL, Rfl: 0   Objective:     BP (!) 168/102 (Cuff Size: Large)   Pulse 96   Temp 97.7 F (36.5 C)   Ht 6' (1.829 m)   Wt 269 lb 12.8 oz (122.4 kg)   SpO2 94%   BMI 36.59  kg/m  BP Readings from Last 3 Encounters:  02/12/23 (!) 168/102  10/04/22 (!) 132/90  09/27/22 (!) 142/100   Wt Readings from Last 3 Encounters:  02/12/23 269 lb 12.8 oz (122.4 kg)  10/04/22 253 lb (114.8 kg)  09/27/22 257 lb 9.6 oz (116.8 kg)      Physical Exam Constitutional:      General: He is not in acute distress.    Appearance: Normal appearance. He is not ill-appearing, toxic-appearing or diaphoretic.  HENT:     Head: Normocephalic and atraumatic.     Right Ear: Tympanic membrane, ear canal and external ear normal.     Left Ear: Tympanic  membrane, ear canal and external ear normal.  Eyes:     General: No scleral icterus.       Right eye: No discharge.        Left eye: No discharge.     Extraocular Movements: Extraocular movements intact.     Conjunctiva/sclera: Conjunctivae normal.     Pupils: Pupils are equal, round, and reactive to light.  Cardiovascular:     Rate and Rhythm: Normal rate and regular rhythm.  Pulmonary:     Effort: Pulmonary effort is normal. No respiratory distress.     Breath sounds: Normal breath sounds. No wheezing, rhonchi or rales.  Abdominal:     General: Bowel sounds are normal.  Musculoskeletal:     Cervical back: No rigidity or tenderness.  Skin:    General: Skin is warm and dry.  Neurological:     Mental Status: He is alert and oriented to person, place, and time.  Psychiatric:        Mood and Affect: Mood normal.        Behavior: Behavior normal.      No results found for any visits on 02/12/23.    The 10-year ASCVD risk score (Arnett DK, et al., 2019) is: 7.7%    Assessment & Plan:   Acute non-recurrent maxillary sinusitis -     Amoxicillin ; Take 1 tablet (875 mg total) by mouth 2 (two) times daily for 10 days.  Dispense: 20 tablet; Refill: 0  Pleurodynia -     EKG 12-Lead -     TSH -     CBC  Exposure to the flu -     Oseltamivir  Phosphate; Take 1 capsule (75 mg total) by mouth 2 (two) times daily for 5 days.  Dispense: 10 capsule; Refill: 0  Tachycardia -     TSH -     CBC -     Carvedilol ; Take 1 tablet (25 mg total) by mouth 2 (two) times daily with a meal.  Dispense: 60 tablet; Refill: 3  Hypertension, uncontrolled -     TSH -     CBC -     Carvedilol ; Take 1 tablet (25 mg total) by mouth 2 (two) times daily with a meal.  Dispense: 60 tablet; Refill: 3 -     Basic metabolic panel    Return in about 8 weeks (around 04/09/2023), or if symptoms worsen or fail to improve.  EKG with chest discomfort shows normal sinus rhythm at 75.  There are no ST segment or  T wave changes.  Will start amoxicillin  875 twice daily for 20 days.  Flu prophylaxis with Tamiflu .  Increase carvedilol  to 25 mL twice daily.  Encourage weight loss.  Elsie Sim Lent, MD

## 2023-02-15 ENCOUNTER — Telehealth: Payer: Self-pay | Admitting: Family Medicine

## 2023-02-15 NOTE — Telephone Encounter (Signed)
 error

## 2023-03-18 ENCOUNTER — Ambulatory Visit: Payer: 59 | Admitting: Family Medicine

## 2023-03-26 ENCOUNTER — Encounter: Payer: Self-pay | Admitting: Nurse Practitioner

## 2023-03-26 ENCOUNTER — Telehealth (INDEPENDENT_AMBULATORY_CARE_PROVIDER_SITE_OTHER): Payer: 59 | Admitting: Nurse Practitioner

## 2023-03-26 DIAGNOSIS — J0110 Acute frontal sinusitis, unspecified: Secondary | ICD-10-CM

## 2023-03-26 DIAGNOSIS — R051 Acute cough: Secondary | ICD-10-CM

## 2023-03-26 MED ORDER — PROMETHAZINE-DM 6.25-15 MG/5ML PO SYRP: 5.0000 mL | ORAL_SOLUTION | Freq: Four times a day (QID) | ORAL | 0 refills | Status: AC | PRN

## 2023-03-26 MED ORDER — AMOXICILLIN-POT CLAVULANATE 875-125 MG PO TABS
1.0000 | ORAL_TABLET | Freq: Two times a day (BID) | ORAL | 0 refills | Status: AC
Start: 2023-03-26 — End: ?

## 2023-03-26 NOTE — Patient Instructions (Signed)
 It was great to see you!  Start augmentin twice a day for 10 days  Start cough syrup every 4 hours as needed, this may make you sleepy  Drink plenty of fluids and get rest  You can continue the over the counter medications you are taking.   Let's follow-up if your symptoms worsen or don't improve.   Take care,  Rodman Pickle, NP

## 2023-03-26 NOTE — Progress Notes (Signed)
 [facility] [address] [place] [address] Dept: [phone] Dept Fax: [phone]  Virtual Video Visit  I connected with Watt Climes Vasbinder on 03/26/23 at 10:00 AM EST by a video enabled telemedicine application and verified that I am speaking with the correct person using two identifiers.  Location patient: Home Location provider: Clinic Persons participating in the virtual visit: Patient; Rodman Pickle, NP; Malena Peer, CMA  I discussed the limitations of evaluation and management by telemedicine and the availability of in person appointments. The patient expressed understanding and agreed to proceed.  Chief Complaint  Patient presents with   Acute Visit    PT C/O of sinus infection and headache for 2 weeks. PT took alka seltzer cold and flu with ibuprofen.    SUBJECTIVE:  HPI: Corey [name] is a 48 y.o. male who presents with sinus pain and headache for 2 weeks.   UPPER RESPIRATORY TRACT INFECTION  Fever: no Cough: yes Shortness of breath: yes with coughing Wheezing: no Chest pain: yes, with cough Chest tightness: no Chest congestion: no Nasal congestion: yes Runny nose: no Post nasal drip: yes Sneezing: no Sore throat: yes Swollen glands: no Sinus pressure: yes Headache: yes Face pain: yes Toothache: no Ear pain: yes bilateral Ear pressure: no bilateral Eyes red/itching:no Eye drainage/crusting: no  Vomiting: no Rash: no Fatigue: yes Sick contacts: yes - coworker Strep contacts: no  Context: stable Recurrent sinusitis: no Relief with OTC cold/cough medications: no  Treatments attempted: alka seltzer cold and flu, ibuprofen    Patient Active Problem List   Diagnosis Date Noted   Pleurodynia 02/12/2023   Exposure to the flu 02/12/2023   Tachycardia 02/12/2023   Prediabetes 10/04/2022   Hemorrhoids 10/04/2022   Obesity (BMI 30-39.9) 05/07/2022   Vasectomy status 05/07/2022   Facial  dermatitis 05/07/2022   Arthritis 05/07/2022   Generalized abdominal pain 02/27/2022   Urinary frequency 02/27/2022   Stressful life event affecting family 02/27/2022   C6 radiculopathy 12/22/2020   Subacromial bursitis of right shoulder joint 12/22/2020   Elevated LDL cholesterol level 11/23/2020   COVID-19 06/09/2019   Acute prostatitis 04/24/2019   Erectile dysfunction 04/24/2019   Heme positive stool 04/24/2019   Benign prostatic hyperplasia with weak urinary stream 04/24/2019   Cauda equina injury without bone injury (HCC) 11/20/2018   New daily persistent headache 10/09/2017   Acute non-recurrent maxillary sinusitis 09/11/2017   Healthcare maintenance 09/11/2017   Medication side effect 08/13/2017   Actinic keratosis due to exposure to sunlight 08/13/2017   Telangiectasia 08/13/2017   Hypertension, uncontrolled 04/08/2017    Past Surgical History:  Procedure Laterality Date   COLONOSCOPY  05/22/2019   ESOPHAGOGASTRODUODENOSCOPY     over 25 years ago. Had a stomach ulcer and possibly dx of barretts esophagus   KNEE SURGERY Right    x2   LITHOTRIPSY     x2-3    UPPER GASTROINTESTINAL ENDOSCOPY  05/22/2019    Family History  Problem Relation Age of Onset   Arthritis Mother    Hypertension Mother    Hypertension Father    Kidney cancer Father        died 70    Asthma Sister    Asthma Sister    Colon cancer Paternal Aunt        died 91   Esophageal cancer Neg Hx    Colon polyps Neg Hx    Rectal cancer Neg Hx    Stomach cancer Neg Hx  Social History   Tobacco Use   Smoking status: Never   Smokeless tobacco: Never  Vaping Use   Vaping status: Never Used  Substance Use Topics   Alcohol use: No   Drug use: No     Current Outpatient Medications:    ALPRAZolam (XANAX) 1 MG tablet, Take 1 tablet by mouth 1 hour prior to procedure, Disp: 1 tablet, Rfl: 0   amLODipine (NORVASC) 10 MG tablet, Take 1 tablet by mouth once daily, Disp: 90 tablet, Rfl: 0    amoxicillin-clavulanate (AUGMENTIN) 875-125 MG tablet, Take 1 tablet by mouth 2 (two) times daily., Disp: 20 tablet, Rfl: 0   carvedilol (COREG) 25 MG tablet, Take 1 tablet (25 mg total) by mouth 2 (two) times daily with a meal., Disp: 60 tablet, Rfl: 3   docusate sodium (COLACE) 100 MG capsule, Take 1 capsule (100 mg total) by mouth 2 (two) times daily., Disp: 50 capsule, Rfl: 0   HYDROcodone-acetaminophen (NORCO/VICODIN) 5-325 MG tablet, Take 1 tablet by mouth every 6 (six) hours as needed for moderate pain., Disp: 8 tablet, Rfl: 0   hydrocortisone (ANUSOL-HC) 25 MG suppository, Place 1 suppository (25 mg total) rectally 2 (two) times daily., Disp: 12 suppository, Rfl: 0   irbesartan (AVAPRO) 300 MG tablet, Take 1 tablet by mouth once daily, Disp: 90 tablet, Rfl: 3   promethazine-dextromethorphan (PROMETHAZINE-DM) 6.25-15 MG/5ML syrup, Take 5 mLs by mouth 4 (four) times daily as needed for cough. Drowsy precautions!, Disp: 118 mL, Rfl: 0  Allergies  Allergen Reactions   Ace Inhibitors Other (See Comments) and Cough    Other reaction(s): Cough (ALLERGY/intolerance)   Morphine And Codeine Hives and Itching   Hydrochlorothiazide Other (See Comments)    Sun Sensitivity Sweating     ROS: See pertinent positives and negatives per HPI.  OBSERVATIONS/OBJECTIVE:  VITALS per patient if applicable: There were no vitals filed for this visit. There is no height or weight on file to calculate BMI.    GENERAL: Alert and oriented. Appears well and in no acute distress.  HEENT: Atraumatic. Conjunctiva clear. No obvious abnormalities on inspection of external nose and ears. Sinus pain with self palpation to frontal sinuses  NECK: Normal movements of the head and neck.  LUNGS: On inspection, no signs of respiratory distress. Breathing rate appears normal. No obvious gross SOB, gasping or wheezing, and no conversational dyspnea.  CV: No obvious cyanosis.  MS: Moves all visible extremities without  noticeable abnormality.  PSYCH/NEURO: Pleasant and cooperative. No obvious depression or anxiety. Speech and thought processing grossly intact.  ASSESSMENT AND PLAN:  Problem List Items Addressed This Visit   None Visit Diagnoses       Acute non-recurrent frontal sinusitis    -  Primary   Start augmentin BID x10 days. Promethazine dm q4hr prn cough. Encourage fluids, rest. F/U if not improving.   Relevant Medications   amoxicillin-clavulanate (AUGMENTIN) 875-125 MG tablet   promethazine-dextromethorphan (PROMETHAZINE-DM) 6.25-15 MG/5ML syrup     Acute cough       Relevant Medications   promethazine-dextromethorphan (PROMETHAZINE-DM) 6.25-15 MG/5ML syrup        I discussed the assessment and treatment plan with the patient. The patient was provided an opportunity to ask questions and all were answered. The patient agreed with the plan and demonstrated an understanding of the instructions.   The patient was advised to call back or seek an in-person evaluation if the symptoms worsen or if the condition fails to improve as anticipated.  Gerre Scull, NP

## 2023-03-28 ENCOUNTER — Ambulatory Visit: Payer: 59 | Admitting: Family Medicine

## 2023-04-08 ENCOUNTER — Ambulatory Visit: Admitting: Family Medicine

## 2023-04-08 ENCOUNTER — Encounter: Payer: Self-pay | Admitting: Family Medicine

## 2023-04-08 VITALS — BP 146/98 | HR 75 | Temp 98.2°F | Wt 262.8 lb

## 2023-04-08 DIAGNOSIS — G4440 Drug-induced headache, not elsewhere classified, not intractable: Secondary | ICD-10-CM

## 2023-04-08 DIAGNOSIS — G43909 Migraine, unspecified, not intractable, without status migrainosus: Secondary | ICD-10-CM

## 2023-04-08 DIAGNOSIS — Z8709 Personal history of other diseases of the respiratory system: Secondary | ICD-10-CM

## 2023-04-08 DIAGNOSIS — I10 Essential (primary) hypertension: Secondary | ICD-10-CM | POA: Diagnosis not present

## 2023-04-08 HISTORY — DX: Migraine, unspecified, not intractable, without status migrainosus: G43.909

## 2023-04-08 HISTORY — DX: Personal history of other diseases of the respiratory system: Z87.09

## 2023-04-08 HISTORY — DX: Drug-induced headache, not elsewhere classified, not intractable: G44.40

## 2023-04-08 MED ORDER — RIZATRIPTAN BENZOATE 10 MG PO TABS: 10.0000 mg | ORAL_TABLET | ORAL | 0 refills | Status: DC | PRN

## 2023-04-08 MED ORDER — PROMETHAZINE HCL 25 MG PO TABS: 25.0000 mg | ORAL_TABLET | Freq: Three times a day (TID) | ORAL | 0 refills | Status: AC | PRN

## 2023-04-08 MED ORDER — PREDNISONE 10 MG PO TABS: 10.0000 mg | ORAL_TABLET | Freq: Two times a day (BID) | ORAL | 0 refills | Status: AC

## 2023-04-08 MED ORDER — ZOLMITRIPTAN 5 MG PO TABS: 5.0000 mg | ORAL_TABLET | ORAL | 0 refills | Status: DC | PRN

## 2023-04-08 NOTE — Progress Notes (Signed)
 Established Patient Office Visit   Subjective:  Patient ID: Corey [name], male    DOB: 24-Jan-1976  Age: 48 y.o. MRN: [id number]  Chief Complaint  Patient presents with   Headache    Headaches loicated at the temporal and frontal area x 2 weeks.     Headache  Associated symptoms include nausea and vomiting. Pertinent negatives include no abdominal pain, blurred vision, eye redness, tingling or weakness.   Encounter Diagnoses  Name Primary?   Hypertension, uncontrolled Yes   Migraine without status migrainosus, not intractable, unspecified migraine type    Rebound headache    History of sinusitis    3-week history of headaches that he has been treating with over-the-counter pain relievers.  These seem to be his usual migraines.  They are often preceded by spots or floaters.  They are associated with light sensitivity nausea and sometimes vomiting.  Small children at home are often sick and he usually picks up what they have.  He has been treated for sinusitis twice since the first of the year.  He is compliant with amlodipine 10 mg, Avapro 300 mg and carvedilol 25 mg twice daily for his blood pressure.  Blood pressure at home runs around 140/80.   Review of Systems  Constitutional: Negative.   HENT: Negative.    Eyes:  Negative for blurred vision, discharge and redness.  Respiratory: Negative.    Cardiovascular: Negative.   Gastrointestinal:  Positive for nausea and vomiting. Negative for abdominal pain.  Genitourinary: Negative.   Musculoskeletal: Negative.  Negative for myalgias.  Skin:  Negative for rash.  Neurological:  Positive for headaches. Negative for tingling, loss of consciousness and weakness.  Endo/Heme/Allergies:  Negative for polydipsia.     Current Outpatient Medications:    ALPRAZolam (XANAX) 1 MG tablet, Take 1 tablet by mouth 1 hour prior to procedure, Disp: 1 tablet, Rfl: 0   amLODipine (NORVASC) 10 MG tablet, Take 1 tablet by mouth once daily, Disp: 90  tablet, Rfl: 0   amoxicillin-clavulanate (AUGMENTIN) 875-125 MG tablet, Take 1 tablet by mouth 2 (two) times daily., Disp: 20 tablet, Rfl: 0   carvedilol (COREG) 25 MG tablet, Take 1 tablet (25 mg total) by mouth 2 (two) times daily with a meal., Disp: 60 tablet, Rfl: 3   docusate sodium (COLACE) 100 MG capsule, Take 1 capsule (100 mg total) by mouth 2 (two) times daily., Disp: 50 capsule, Rfl: 0   HYDROcodone-acetaminophen (NORCO/VICODIN) 5-325 MG tablet, Take 1 tablet by mouth every 6 (six) hours as needed for moderate pain., Disp: 8 tablet, Rfl: 0   hydrocortisone (ANUSOL-HC) 25 MG suppository, Place 1 suppository (25 mg total) rectally 2 (two) times daily., Disp: 12 suppository, Rfl: 0   irbesartan (AVAPRO) 300 MG tablet, Take 1 tablet by mouth once daily, Disp: 90 tablet, Rfl: 3   predniSONE (DELTASONE) 10 MG tablet, Take 1 tablet (10 mg total) by mouth 2 (two) times daily with a meal for 7 days., Disp: 14 tablet, Rfl: 0   promethazine (PHENERGAN) 25 MG tablet, Take 1 tablet (25 mg total) by mouth every 8 (eight) hours as needed for nausea or vomiting., Disp: 20 tablet, Rfl: 0   promethazine-dextromethorphan (PROMETHAZINE-DM) 6.25-15 MG/5ML syrup, Take 5 mLs by mouth 4 (four) times daily as needed for cough. Drowsy precautions!, Disp: 118 mL, Rfl: 0   rizatriptan (MAXALT) 10 MG tablet, Take 1 tablet (10 mg total) by mouth as needed for migraine. May repeat in 2 hours if needed, Disp: 10  tablet, Rfl: 0   Objective:     BP (!) 146/98   Pulse 75   Temp 98.2 F (36.8 C)   Wt 262 lb 12.8 oz (119.2 kg)   SpO2 99%   BMI 35.64 kg/m  BP Readings from Last 3 Encounters:  04/08/23 (!) 146/98  02/12/23 (!) 168/102  10/04/22 (!) 132/90   Wt Readings from Last 3 Encounters:  04/08/23 262 lb 12.8 oz (119.2 kg)  02/12/23 269 lb 12.8 oz (122.4 kg)  10/04/22 253 lb (114.8 kg)      Physical Exam Constitutional:      General: He is not in acute distress.    Appearance: Normal appearance. He is  not ill-appearing, toxic-appearing or diaphoretic.  HENT:     Head: Normocephalic and atraumatic.     Right Ear: External ear normal.     Left Ear: External ear normal.  Eyes:     General: No scleral icterus.       Right eye: No discharge.        Left eye: No discharge.     Extraocular Movements: Extraocular movements intact.     Conjunctiva/sclera: Conjunctivae normal.  Pulmonary:     Effort: Pulmonary effort is normal. No respiratory distress.  Skin:    General: Skin is warm and dry.  Neurological:     Mental Status: He is alert and oriented to person, place, and time.  Psychiatric:        Mood and Affect: Mood normal.        Behavior: Behavior normal.      No results found for any visits on 04/08/23.    The 10-year ASCVD risk score (Arnett DK, et al., 2019) is: 6.1%    Assessment & Plan:   Hypertension, uncontrolled  Migraine without status migrainosus, not intractable, unspecified migraine type -     Promethazine HCl; Take 1 tablet (25 mg total) by mouth every 8 (eight) hours as needed for nausea or vomiting.  Dispense: 20 tablet; Refill: 0 -     Rizatriptan Benzoate; Take 1 tablet (10 mg total) by mouth as needed for migraine. May repeat in 2 hours if needed  Dispense: 10 tablet; Refill: 0  Rebound headache -     predniSONE; Take 1 tablet (10 mg total) by mouth 2 (two) times daily with a meal for 7 days.  Dispense: 14 tablet; Refill: 0  History of sinusitis -     DG Sinuses Complete; Future    Return Has follow-up on the 17th..  Low-dose prednisone low-dose prednisoneLow dose predniso thank you so much triage seen in the morning yeah it did not start out well with the lab dated ne 10mg  bid for 7 days for rebound. Phenergan 25mg  q 8 prn. Rizatriptan prn.  Sinus films.   Mliss Sax, MD

## 2023-04-09 ENCOUNTER — Ambulatory Visit
Admission: RE | Admit: 2023-04-09 | Discharge: 2023-04-09 | Disposition: A | Discharge status: Home / Self Care | Source: Ambulatory Visit | Attending: Family Medicine | Admitting: Family Medicine

## 2023-04-09 DIAGNOSIS — Z8709 Personal history of other diseases of the respiratory system: Secondary | ICD-10-CM

## 2023-04-10 ENCOUNTER — Encounter: Payer: Self-pay | Admitting: Family Medicine

## 2023-04-19 ENCOUNTER — Ambulatory Visit: Payer: 59 | Admitting: Family Medicine

## 2023-04-22 ENCOUNTER — Ambulatory Visit: Payer: 59 | Admitting: Family Medicine

## 2023-04-22 ENCOUNTER — Encounter: Payer: Self-pay | Admitting: Family Medicine

## 2023-04-22 VITALS — BP 144/92 | HR 77 | Temp 97.8°F | Ht 72.0 in | Wt 259.4 lb

## 2023-04-22 DIAGNOSIS — I10 Essential (primary) hypertension: Secondary | ICD-10-CM

## 2023-04-22 DIAGNOSIS — G43909 Migraine, unspecified, not intractable, without status migrainosus: Secondary | ICD-10-CM | POA: Diagnosis not present

## 2023-04-22 DIAGNOSIS — R7303 Prediabetes: Secondary | ICD-10-CM

## 2023-04-22 DIAGNOSIS — E7800 Pure hypercholesterolemia, unspecified: Secondary | ICD-10-CM | POA: Diagnosis not present

## 2023-04-22 DIAGNOSIS — J301 Allergic rhinitis due to pollen: Secondary | ICD-10-CM | POA: Diagnosis not present

## 2023-04-22 DIAGNOSIS — R0683 Snoring: Secondary | ICD-10-CM

## 2023-04-22 LAB — COMPREHENSIVE METABOLIC PANEL
ALT: 23 U/L (ref 0–53)
AST: 14 U/L (ref 0–37)
Albumin: 4.7 g/dL (ref 3.5–5.2)
Alkaline Phosphatase: 79 U/L (ref 39–117)
BUN: 12 mg/dL (ref 6–23)
CO2: 30 meq/L (ref 19–32)
Calcium: 9.6 mg/dL (ref 8.4–10.5)
Chloride: 104 meq/L (ref 96–112)
Creatinine, Ser: 1.02 mg/dL (ref 0.40–1.50)
GFR: 86.01 mL/min (ref >60.00)
Glucose, Bld: 107 mg/dL — ABNORMAL HIGH (ref 70–99)
Potassium: 4.5 meq/L (ref 3.5–5.1)
Sodium: 141 meq/L (ref 135–145)
Total Bilirubin: 0.8 mg/dL (ref 0.2–1.2)
Total Protein: 6.6 g/dL (ref 6.0–8.3)

## 2023-04-22 LAB — LIPID PANEL
Cholesterol: 227 mg/dL — ABNORMAL HIGH (ref 0–200)
HDL: 46.1 mg/dL (ref >39.00)
LDL Cholesterol: 144 mg/dL — ABNORMAL HIGH (ref 0–99)
NonHDL: 181.16
Total CHOL/HDL Ratio: 5
Triglycerides: 187 mg/dL — ABNORMAL HIGH (ref 0.0–149.0)
VLDL: 37.4 mg/dL (ref 0.0–40.0)

## 2023-04-22 LAB — HEMOGLOBIN A1C: Hgb A1c MFr Bld: 6 % (ref 4.6–6.5)

## 2023-04-22 MED ORDER — FLUTICASONE PROPIONATE 50 MCG/ACT NA SUSP: 2.0000 | Freq: Every day | NASAL | 3 refills | Status: AC

## 2023-04-22 MED ORDER — RIZATRIPTAN BENZOATE 10 MG PO TABS: 10.0000 mg | ORAL_TABLET | ORAL | 0 refills | Status: AC | PRN

## 2023-04-22 MED ORDER — FLUTICASONE PROPIONATE 50 MCG/ACT NA SUSP
2.0000 | Freq: Every day | NASAL | 3 refills | Status: DC
Start: 2023-04-22 — End: 2023-04-22

## 2023-04-22 NOTE — Progress Notes (Addendum)
 Established Patient Office Visit   Subjective:  Patient ID: Corey [name], male    DOB: 01-13-1976  Age: 48 y.o. MRN: [id number]  Chief Complaint  Patient presents with   Headache    Follow up. Rebound headache. Pt states Rizatriptan helps with headaches and requested another refill. BP elevated at home and in office.     Headache  Pertinent negatives include no abdominal pain, blurred vision, eye redness, tingling or weakness.   Encounter Diagnoses  Name Primary?   Hypertension, uncontrolled Yes   Migraine without status migrainosus, not intractable, unspecified migraine type    Allergic rhinitis due to pollen, unspecified seasonality    Elevated LDL cholesterol level    Prediabetes    Snores    Blood pressure remains elevated despite compliance with Avapro 300, carvedilol 25 twice daily and amlodipine 10 mg daily.  Has worked hard to emanate sodium from his diet.  He rarely drinks and does not use illicit drugs.  PM headaches seem to be helped with Maxalt.  He does have some evening flushing in his face.  Mom required coronary artery stenting at age 41.  Patient's last A1c was 149.  Just   Review of Systems  Constitutional: Negative.   HENT: Negative.    Eyes:  Negative for blurred vision, discharge and redness.  Respiratory: Negative.    Cardiovascular: Negative.   Gastrointestinal:  Negative for abdominal pain.  Genitourinary: Negative.   Musculoskeletal: Negative.  Negative for myalgias.  Skin:  Negative for rash.  Neurological:  Positive for headaches. Negative for tingling, loss of consciousness and weakness.  Endo/Heme/Allergies:  Negative for polydipsia.     Current Outpatient Medications:    ALPRAZolam (XANAX) 1 MG tablet, Take 1 tablet by mouth 1 hour prior to procedure, Disp: 1 tablet, Rfl: 0   amLODipine (NORVASC) 10 MG tablet, Take 1 tablet by mouth once daily, Disp: 90 tablet, Rfl: 0   amoxicillin-clavulanate (AUGMENTIN) 875-125 MG tablet, Take 1 tablet by  mouth 2 (two) times daily., Disp: 20 tablet, Rfl: 0   carvedilol (COREG) 25 MG tablet, Take 1 tablet (25 mg total) by mouth 2 (two) times daily with a meal., Disp: 60 tablet, Rfl: 3   docusate sodium (COLACE) 100 MG capsule, Take 1 capsule (100 mg total) by mouth 2 (two) times daily., Disp: 50 capsule, Rfl: 0   HYDROcodone-acetaminophen (NORCO/VICODIN) 5-325 MG tablet, Take 1 tablet by mouth every 6 (six) hours as needed for moderate pain., Disp: 8 tablet, Rfl: 0   hydrocortisone (ANUSOL-HC) 25 MG suppository, Place 1 suppository (25 mg total) rectally 2 (two) times daily., Disp: 12 suppository, Rfl: 0   irbesartan (AVAPRO) 300 MG tablet, Take 1 tablet by mouth once daily, Disp: 90 tablet, Rfl: 3   promethazine (PHENERGAN) 25 MG tablet, Take 1 tablet (25 mg total) by mouth every 8 (eight) hours as needed for nausea or vomiting., Disp: 20 tablet, Rfl: 0   promethazine-dextromethorphan (PROMETHAZINE-DM) 6.25-15 MG/5ML syrup, Take 5 mLs by mouth 4 (four) times daily as needed for cough. Drowsy precautions!, Disp: 118 mL, Rfl: 0   fluticasone (FLONASE) 50 MCG/ACT nasal spray, Place 2 sprays into both nostrils daily., Disp: 16 g, Rfl: 3   rizatriptan (MAXALT) 10 MG tablet, Take 1 tablet (10 mg total) by mouth as needed for migraine. May repeat in 2 hours if needed, Disp: 10 tablet, Rfl: 0   Objective:     BP (!) 144/92   Pulse 77   Temp 97.8 F (36.6  C)   Ht 6' (1.829 m)   Wt 259 lb 6.4 oz (117.7 kg)   SpO2 97%   BMI 35.18 kg/m  BP Readings from Last 3 Encounters:  04/22/23 (!) 144/92  04/08/23 (!) 146/98  02/12/23 (!) 168/102   Wt Readings from Last 3 Encounters:  04/22/23 259 lb 6.4 oz (117.7 kg)  04/08/23 262 lb 12.8 oz (119.2 kg)  02/12/23 269 lb 12.8 oz (122.4 kg)      Physical Exam Constitutional:      General: He is not in acute distress.    Appearance: Normal appearance. He is not ill-appearing, toxic-appearing or diaphoretic.  HENT:     Head: Normocephalic and atraumatic.      Right Ear: External ear normal.     Left Ear: External ear normal.  Eyes:     General: No scleral icterus.       Right eye: No discharge.        Left eye: No discharge.     Extraocular Movements: Extraocular movements intact.     Conjunctiva/sclera: Conjunctivae normal.  Pulmonary:     Effort: Pulmonary effort is normal. No respiratory distress.  Skin:    General: Skin is warm and dry.  Neurological:     Mental Status: He is alert and oriented to person, place, and time.  Psychiatric:        Mood and Affect: Mood normal.        Behavior: Behavior normal.          The 10-year ASCVD risk score (Arnett DK, et al., 2019) is: 6.5%    Assessment & Plan:   Hypertension, uncontrolled -     Aldosterone + renin activity w/ ratio -     Catecholamines, fractionated, urine, 24 hour -     Comprehensive metabolic panel -     Ambulatory referral to Cardiology  Migraine without status migrainosus, not intractable, unspecified migraine type -     Rizatriptan Benzoate; Take 1 tablet (10 mg total) by mouth as needed for migraine. May repeat in 2 hours if needed  Dispense: 10 tablet; Refill: 0  Allergic rhinitis due to pollen, unspecified seasonality -     Fluticasone Propionate; Place 2 sprays into both nostrils daily.  Dispense: 16 g; Refill: 3  Elevated LDL cholesterol level -     Lipid panel -     Comprehensive metabolic panel  Prediabetes -     Hemoglobin A1c -     Comprehensive metabolic panel  Snores -     Ambulatory referral to Pulmonology    Return in about 8 weeks (around 06/17/2023).  Blood pressure uncontrolled on 3 agents.  Continue sodium restriction.  He is experiencing pain and headaches with flushing.  24-hour urine for catecholamines.  Cardiology referral for uncontrolled hypertension.  Will consider statin with his mother's cardiovascular history.  Rechecking A1c.  Mliss Sax, MD

## 2023-04-26 LAB — CATECHOLAMINES, FRACTIONATED, URINE, 24 HOUR
Calc Total (E+NE): 90 ug/(24.h) (ref 26–121)
Creatinine, Urine mg/day-CATEUR: 1.66 g/(24.h) (ref 0.50–2.15)
Dopamine 24 Hr Urine: 193 ug/(24.h) (ref 52–480)
Epinephrine, 24H, Ur: 9 ug/(24.h) (ref 2–24)
Norepinephrine, 24H, Ur: 81 ug/(24.h) (ref 15–100)
Total Volume: 1700 mL

## 2023-04-28 LAB — ALDOSTERONE + RENIN ACTIVITY W/ RATIO
ALDO / PRA Ratio: 26.7 ratio (ref 0.9–28.9)
Aldosterone: 8 ng/dL
Renin Activity: 0.3 ng/mL/h (ref 0.25–5.82)

## 2023-04-29 NOTE — Addendum Note (Signed)
 Addended by: Andrez Grime on: 04/29/2023 10:21 AM   Modules accepted: Orders

## 2023-04-30 ENCOUNTER — Other Ambulatory Visit: Payer: Self-pay | Admitting: Family Medicine

## 2023-04-30 DIAGNOSIS — I10 Essential (primary) hypertension: Secondary | ICD-10-CM

## 2023-06-05 ENCOUNTER — Other Ambulatory Visit: Payer: Self-pay

## 2023-06-05 ENCOUNTER — Other Ambulatory Visit: Payer: Self-pay | Admitting: Family Medicine

## 2023-06-05 DIAGNOSIS — R000 Tachycardia, unspecified: Secondary | ICD-10-CM

## 2023-06-05 DIAGNOSIS — I10 Essential (primary) hypertension: Secondary | ICD-10-CM

## 2023-06-05 MED ORDER — CARVEDILOL 25 MG PO TABS: 25.0000 mg | ORAL_TABLET | Freq: Two times a day (BID) | ORAL | 3 refills | Status: DC

## 2023-06-10 ENCOUNTER — Other Ambulatory Visit: Payer: Self-pay | Admitting: Family Medicine

## 2023-06-10 DIAGNOSIS — I10 Essential (primary) hypertension: Secondary | ICD-10-CM

## 2023-06-12 DIAGNOSIS — N200 Calculus of kidney: Secondary | ICD-10-CM | POA: Insufficient documentation

## 2023-06-13 ENCOUNTER — Ambulatory Visit

## 2023-06-13 VITALS — BP 162/108 | HR 76 | Ht 71.0 in | Wt 262.4 lb

## 2023-06-13 DIAGNOSIS — E669 Obesity, unspecified: Secondary | ICD-10-CM

## 2023-06-13 DIAGNOSIS — E782 Mixed hyperlipidemia: Secondary | ICD-10-CM | POA: Diagnosis not present

## 2023-06-13 DIAGNOSIS — I10 Essential (primary) hypertension: Secondary | ICD-10-CM

## 2023-06-13 DIAGNOSIS — N200 Calculus of kidney: Secondary | ICD-10-CM

## 2023-06-13 MED ORDER — HYDROCHLOROTHIAZIDE 25 MG PO TABS: 25.0000 mg | ORAL_TABLET | Freq: Every day | ORAL | 3 refills | Status: DC

## 2023-06-13 NOTE — Assessment & Plan Note (Signed)
 Continue with dietary lifestyle modifications. Emphasize role of dietary and lifestyle habits on blood pressure control along with the role of sleep apnea. Continue evaluation for sleep apnea and if present would strongly encourage treatment with CPAP if he is able to tolerate and agreeable.

## 2023-06-13 NOTE — Patient Instructions (Addendum)
 Medication Instructions:    Start Hydrochlorothiazide 25 mg once a day  *If you need a refill on your cardiac medications before your next appointment, please call your pharmacy*   Lab Work: None Ordered If you have labs (blood work) drawn today and your tests are completely normal, you will receive your results only by: MyChart Message (if you have MyChart) OR A paper copy in the mail If you have any lab test that is abnormal or we need to change your treatment, we will call you to review the results.   Testing/Procedures: Your physician has requested that you have a renal artery duplex. During this test, an ultrasound is used to evaluate blood flow to the kidneys. Allow one hour for this exam. Do not eat after midnight the day before and avoid carbonated beverages. Take your medications as you usually do.  Echocardiogram An echocardiogram is a test that uses sound waves (ultrasound) to produce images of the heart. Images from an echocardiogram can provide important information about: Heart size and shape. The size and thickness and movement of your heart's walls. Heart muscle function and strength. Heart valve function or if you have stenosis. Stenosis is when the heart valves are too narrow. If blood is flowing backward through the heart valves (regurgitation). A tumor or infectious growth around the heart valves. Areas of heart muscle that are not working well because of poor blood flow or injury from a heart attack. Aneurysm detection. An aneurysm is a weak or damaged part of an artery wall. The wall bulges out from the normal force of blood pumping through the body. Tell a health care provider about: Any allergies you have. All medicines you are taking, including vitamins, herbs, eye drops, creams, and over-the-counter medicines. Any blood disorders you have. Any surgeries you have had. Any medical conditions you have. Whether you are pregnant or may be pregnant. What are  the risks? Generally, this is a safe test. However, problems may occur, including an allergic reaction to dye (contrast) that may be used during the test. What happens before the test? No specific preparation is needed. You may eat and drink normally. What happens during the test?  You will take off your clothes from the waist up and put on a hospital gown. Electrodes or electrocardiogram (ECG)patches may be placed on your chest. The electrodes or patches are then connected to a device that monitors your heart rate and rhythm. You will lie down on a table for an ultrasound exam. A gel will be applied to your chest to help sound waves pass through your skin. A handheld device, called a transducer, will be pressed against your chest and moved over your heart. The transducer produces sound waves that travel to your heart and bounce back (or "echo" back) to the transducer. These sound waves will be captured in real-time and changed into images of your heart that can be viewed on a video monitor. The images will be recorded on a computer and reviewed by your health care provider. You may be asked to change positions or hold your breath for a short time. This makes it easier to get different views or better views of your heart. In some cases, you may receive contrast through an IV in one of your veins. This can improve the quality of the pictures from your heart. The procedure may vary among health care providers and hospitals. What can I expect after the test? You may return to your normal, everyday life, including  diet, activities, and medicines, unless your health care provider tells you not to do that. Follow these instructions at home: It is up to you to get the results of your test. Ask your health care provider, or the department that is doing the test, when your results will be ready. Keep all follow-up visits. This is important. Summary An echocardiogram is a test that uses sound waves  (ultrasound) to produce images of the heart. Images from an echocardiogram can provide important information about the size and shape of your heart, heart muscle function, heart valve function, and other possible heart problems. You do not need to do anything to prepare before this test. You may eat and drink normally. After the echocardiogram is completed, you may return to your normal, everyday life, unless your health care provider tells you not to do that. This information is not intended to replace advice given to you by your health care provider. Make sure you discuss any questions you have with your health care provider. Document Revised: 10/05/2020 Document Reviewed: 09/15/2019 Elsevier Patient Education  2023 Elsevier Inc.        Follow-Up: At [facility], you and your health needs are our priority.  As part of our continuing mission to provide you with exceptional heart care, we have created designated Provider Care Teams.  These Care Teams include your primary Cardiologist (physician) and Advanced Practice Providers (APPs -  Physician Assistants and Nurse Practitioners) who all work together to provide you with the care you need, when you need it.  We recommend signing up for the patient portal called "MyChart".  Sign up information is provided on this After Visit Summary.  MyChart is used to connect with patients for Virtual Visits (Telemedicine).  Patients are able to view lab/test results, encounter notes, upcoming appointments, etc.  Non-urgent messages can be sent to your provider as well.   To learn more about what you can do with MyChart, go to [email or web address].    Your next appointment:   3 month follow up

## 2023-06-13 NOTE — Assessment & Plan Note (Signed)
 The 10-year ASCVD risk score (Arnett DK, et al., 2019) is: 7.9%   Values used to calculate the score:     Age: 48 years     Sex: Male     Is Non-Hispanic African American: No     Diabetic: No     Tobacco smoker: No     Systolic Blood Pressure: 162 mmHg     Is BP treated: Yes     HDL Cholesterol: 46.1 mg/dL     Total Cholesterol: 227 mg/dL  11-BJYN cardiovascular risk slightly above average. Continue with dietary modifications. Review further with PCP for pharmacotherapy if indicated. Will address further at subsequent follow-up visit.

## 2023-06-13 NOTE — Progress Notes (Signed)
 Cardiology Consultation:    Date:  06/13/2023   ID:  Corey [name], DOB Apr 08, 1975, MRN [id number]  PCP:  Tonna Frederic, MD  Cardiologist:  Daymon Evans Ramon Zanders, MD   Referring MD: Tonna Frederic   No chief complaint on file.    ASSESSMENT AND PLAN:   Mr. Corey [name] 48 year old male patient with history of hypertension, prediabetes, hyperlipidemia, migraine, allergic rhinitis, snoring and suspected for sleep apnea pending evaluation.  Here for further evaluation of hypertension.  Problem List Items Addressed This Visit     Hypertension, uncontrolled - Primary   Uncontrolled. Target blood pressure below 130/80 mmHg. Resistant hypertension despite being on 3 blood pressure medications.  Secondary causes of hypertension evaluated with blood work and urine test for metanephrines unremarkable, aldosterone renin ratios unremarkable.  Proceed with bilateral renal artery duplex study.  Continue with current medications amlodipine  10 mg once daily Carvedilol  25 mg twice daily Irbesartan  300 mg once daily Add hydrochlorothiazide 25 mg once daily.  Advised to bring in his home device for blood pressure control to be assessed at either primary care's office or with our office at next visit.       Relevant Medications   hydrochlorothiazide (HYDRODIURIL) 25 MG tablet   Other Relevant Orders   EKG 12-Lead (Completed)   VAS US  RENAL ARTERY DUPLEX   ECHOCARDIOGRAM COMPLETE   Obesity (BMI 30-39.9)   Continue with dietary lifestyle modifications. Emphasize role of dietary and lifestyle habits on blood pressure control along with the role of sleep apnea. Continue evaluation for sleep apnea and if present would strongly encourage treatment with CPAP if he is able to tolerate and agreeable.       Mixed hyperlipidemia   The 10-year ASCVD risk score (Arnett DK, et al., 2019) is: 7.9%   Values used to calculate the score:     Age: 72 years     Sex: Male     Is  Non-Hispanic African American: No     Diabetic: No     Tobacco smoker: No     Systolic Blood Pressure: 162 mmHg     Is BP treated: Yes     HDL Cholesterol: 46.1 mg/dL     Total Cholesterol: 227 mg/dL  47-WGNF cardiovascular risk slightly above average. Continue with dietary modifications. Review further with PCP for pharmacotherapy if indicated. Will address further at subsequent follow-up visit.      Relevant Medications   hydrochlorothiazide (HYDRODIURIL) 25 MG tablet   Return to clinic 10 typically in 3 months.   History of Present Illness:    Corey [name] is a 48 y.o. male who is being seen today for the evaluation of hypertension at the request of Tonna Frederic,*.   Pleasant gentleman here for the visit by himself.  Works as an Training and development officer.  Has history of hypertension, prediabetes, hyperlipidemia, migraine, allergic rhinitis, snoring and suspected for sleep apnea pending pulmonary evaluation.   Mentions no significant symptoms of chest pain or shortness of breath.  Does not exercise routinely other than walking.  Keeps up with his activities at home and work.  Feels somewhat tired towards the end of the day.  Denies any palpitations, lightheadedness, dizziness or syncopal episodes.  No pedal edema.  Has a significant smoking as noted by his wife and currently pending sleep apnea evaluation.  Mentions has longstanding history of hypertension since his mid 30s and has been on medications from late 30s. Consistently takes his medications.  Does  not smoke, drink alcohol or use recreational drugs.  Family history mentions mother had MI in her late 83s.  EKG in the clinic today shows sinus rhythm heart rate 76/min, PR interval 168 ms, QTc 411 ms normal.  QRS duration 94 ms RSR prime pattern suggesting incomplete right bundle branch block.  Blood work from 04/22/2023 lipid panel total cholesterol 227, triglycerides 187, HDL 46, LDL 144. CMP unremarkable  with BUN 12, creatinine 1.02, EGFR 86. Normal transaminases and alkaline phosphatase Hemoglobin A1c 6 Aldosterone renin ratio 26.7. No evidence of elevated metanephrines.    Past Medical History:  Diagnosis Date   Actinic keratosis due to exposure to sunlight 08/13/2017   Acute bilateral low back pain with sciatica 02/21/2016   Acute non-recurrent maxillary sinusitis 09/11/2017   Acute prostatitis 04/24/2019   Arthritis 05/07/2022   Barrett's esophagus 01/23/2015   Benign prostatic hyperplasia with weak urinary stream 04/24/2019   C6 radiculopathy 12/22/2020   Cauda equina injury without bone injury (HCC) 11/20/2018   Cellulitis 05/19/2013   Closed displaced fracture of distal phalanx of left great toe 12/28/2019   Congenital pes planus 01/23/2015   COVID-19 06/09/2019   Dysuria 07/19/2016   Elevated LDL cholesterol level 11/23/2020   Erectile dysfunction 04/24/2019   Esophageal reflux 01/23/2015   Excessive daytime sleepiness 02/22/2015   Exposure to the flu 02/12/2023   Facial dermatitis 05/07/2022   Flank pain 02/21/2016   Generalized abdominal pain 02/27/2022   Healthcare maintenance 09/11/2017   Heme positive stool 04/24/2019   Hemorrhoids 10/04/2022   History of sinusitis 04/08/2023   Hypertension    Hypertension, uncontrolled 04/08/2017   Impaired fasting glucose 01/23/2015   Kidney stones    Medication side effect 08/13/2017   Migraine without status migrainosus, not intractable 04/08/2023   Mixed hyperlipidemia 01/23/2015   Morbid obesity (HCC) 05/09/2013   New daily persistent headache 10/09/2017   Obesity (BMI 30-39.9) 05/07/2022   Obstructive sleep apnea syndrome 05/24/2015   Pleurodynia 02/12/2023   Prediabetes 10/04/2022   Primary snoring 02/22/2015   Rebound headache 04/08/2023   Right lower quadrant abdominal pain 02/21/2016   Sinusitis 01/06/2016   Stressful life event affecting family 02/27/2022   Subacromial bursitis of right shoulder joint  12/22/2020   Tachycardia 02/12/2023   Telangiectasia 08/13/2017   Urinary frequency 02/27/2022   Vasectomy status 05/07/2022    Past Surgical History:  Procedure Laterality Date   COLONOSCOPY  05/22/2019   ESOPHAGOGASTRODUODENOSCOPY     over 25 years ago. Had a stomach ulcer and possibly dx of barretts esophagus   KNEE SURGERY Right    x2   LITHOTRIPSY     x2-3    UPPER GASTROINTESTINAL ENDOSCOPY  05/22/2019    Current Medications: Current Meds  Medication Sig   ALPRAZolam  (XANAX ) 1 MG tablet Take 1 tablet by mouth 1 hour prior to procedure   amLODipine  (NORVASC ) 10 MG tablet Take 1 tablet by mouth once daily   amoxicillin -clavulanate (AUGMENTIN ) 875-125 MG tablet Take 1 tablet by mouth 2 (two) times daily.   carvedilol  (COREG ) 25 MG tablet TAKE 1 TABLET BY MOUTH TWICE DAILY WITH A MEAL   carvedilol  (COREG ) 25 MG tablet Take 1 tablet (25 mg total) by mouth 2 (two) times daily with a meal.   docusate sodium  (COLACE) 100 MG capsule Take 1 capsule (100 mg total) by mouth 2 (two) times daily.   fluticasone  (FLONASE ) 50 MCG/ACT nasal spray Place 2 sprays into both nostrils daily.   hydrochlorothiazide (HYDRODIURIL) 25 MG tablet  Take 1 tablet (25 mg total) by mouth daily.   HYDROcodone -acetaminophen  (NORCO/VICODIN) 5-325 MG tablet Take 1 tablet by mouth every 6 (six) hours as needed for moderate pain.   hydrocortisone  (ANUSOL -HC) 25 MG suppository Place 1 suppository (25 mg total) rectally 2 (two) times daily.   irbesartan  (AVAPRO ) 300 MG tablet Take 1 tablet by mouth once daily   promethazine  (PHENERGAN ) 25 MG tablet Take 1 tablet (25 mg total) by mouth every 8 (eight) hours as needed for nausea or vomiting.   promethazine -dextromethorphan (PROMETHAZINE -DM) 6.25-15 MG/5ML syrup Take 5 mLs by mouth 4 (four) times daily as needed for cough. Drowsy precautions!   rizatriptan  (MAXALT ) 10 MG tablet Take 1 tablet (10 mg total) by mouth as needed for migraine. May repeat in 2 hours if needed      Allergies:   Ace inhibitors, Morphine and codeine, and Hydrochlorothiazide   Social History   Socioeconomic History   Marital status: Married    Spouse name: Not on file   Number of children: Not on file   Years of education: Not on file   Highest education level: Not on file  Occupational History   Not on file  Tobacco Use   Smoking status: Never   Smokeless tobacco: Never  Vaping Use   Vaping status: Never Used  Substance and Sexual Activity   Alcohol use: No   Drug use: No   Sexual activity: Yes  Other Topics Concern   Not on file  Social History Narrative   Not on file   Social Drivers of Health   Financial Resource Strain: Not on file  Food Insecurity: Not on file  Transportation Needs: Not on file  Physical Activity: Not on file  Stress: Not on file  Social Connections: Not on file     Family History: The patient's family history includes Arthritis in his mother; Asthma in his sister and sister; Colon cancer in his paternal aunt; Hypertension in his father and mother; Kidney cancer in his father. There is no history of Esophageal cancer, Colon polyps, Rectal cancer, or Stomach cancer. ROS:   Please see the history of present illness.    All 14 point review of systems negative except as described per history of present illness.  EKGs/Labs/Other Studies Reviewed:    The following studies were reviewed today:   EKG:  EKG Interpretation Date/Time:  Thursday Jun 13 2023 08:48:24 EDT Ventricular Rate:  76 PR Interval:  168 QRS Duration:  94 QT Interval:  366 QTC Calculation: 411 R Axis:   -6  Text Interpretation:  Normal sinus rhythm Incomplete right bundle branch block No previous ECGs available Confirmed by Bertha Broad reddy [phone]) on 06/13/2023 9:11:23 AM    Recent Labs: 02/12/2023: Hemoglobin 16.8; Platelets 276.0; TSH 0.93 04/22/2023: ALT 23; BUN 12; Creatinine, Ser 1.02; Potassium 4.5; Sodium 141  Recent Lipid Panel    Component Value  Date/Time   CHOL 227 (H) 04/22/2023 0915   TRIG 187.0 (H) 04/22/2023 0915   HDL 46.10 04/22/2023 0915   CHOLHDL 5 04/22/2023 0915   VLDL 37.4 04/22/2023 0915   LDLCALC 144 (H) 04/22/2023 0915   LDLDIRECT 174.0 05/24/2021 0915    Physical Exam:    VS:  BP (!) 162/108   Pulse 76   Ht 5\' 11"  (1.803 m)   Wt 262 lb 6.4 oz (119 kg)   SpO2 97%   BMI 36.60 kg/m     Wt Readings from Last 3 Encounters:  06/13/23 262 lb 6.4  oz (119 kg)  04/22/23 259 lb 6.4 oz (117.7 kg)  04/08/23 262 lb 12.8 oz (119.2 kg)     GENERAL:  Well nourished, well developed in no acute distress NECK: No JVD; No carotid bruits CARDIAC: RRR, S1 and S2 present, no murmurs, no rubs, no gallops CHEST:  Clear to auscultation without rales, wheezing or rhonchi  Extremities: No pitting pedal edema. Pulses bilaterally symmetric with radial 2+ and dorsalis pedis 2+ NEUROLOGIC:  Alert and oriented x 3  Medication Adjustments/Labs and Tests Ordered: Current medicines are reviewed at length with the patient today.  Concerns regarding medicines are outlined above.  Orders Placed This Encounter  Procedures   EKG 12-Lead   ECHOCARDIOGRAM COMPLETE   VAS US  RENAL ARTERY DUPLEX   Meds ordered this encounter  Medications   hydrochlorothiazide (HYDRODIURIL) 25 MG tablet    Sig: Take 1 tablet (25 mg total) by mouth daily.    Dispense:  90 tablet    Refill:  3    Signed, Hazeline Charnley reddy Syretta Kochel, MD, MPH, [facility]. 06/13/2023 9:34 AM    [facility]

## 2023-06-13 NOTE — Assessment & Plan Note (Signed)
 Uncontrolled. Target blood pressure below 130/80 mmHg. Resistant hypertension despite being on 3 blood pressure medications.  Secondary causes of hypertension evaluated with blood work and urine test for metanephrines unremarkable, aldosterone renin ratios unremarkable.  Proceed with bilateral renal artery duplex study.  Continue with current medications amlodipine  10 mg once daily Carvedilol  25 mg twice daily Irbesartan  300 mg once daily Add hydrochlorothiazide 25 mg once daily.  Advised to bring in his home device for blood pressure control to be assessed at either primary care's office or with our office at next visit.

## 2023-06-25 ENCOUNTER — Ambulatory Visit: Admitting: Family Medicine

## 2023-06-27 ENCOUNTER — Other Ambulatory Visit: Payer: Self-pay

## 2023-06-27 DIAGNOSIS — E669 Obesity, unspecified: Secondary | ICD-10-CM

## 2023-06-27 DIAGNOSIS — I10 Essential (primary) hypertension: Secondary | ICD-10-CM

## 2023-06-27 DIAGNOSIS — R000 Tachycardia, unspecified: Secondary | ICD-10-CM

## 2023-06-27 DIAGNOSIS — E782 Mixed hyperlipidemia: Secondary | ICD-10-CM

## 2023-07-03 ENCOUNTER — Ambulatory Visit (INDEPENDENT_AMBULATORY_CARE_PROVIDER_SITE_OTHER): Admitting: Pulmonary Disease

## 2023-07-03 ENCOUNTER — Encounter: Payer: Self-pay | Admitting: Pulmonary Disease

## 2023-07-03 VITALS — BP 157/99 | HR 63 | Ht 71.0 in | Wt 267.0 lb

## 2023-07-03 DIAGNOSIS — Z6837 Body mass index (BMI) 37.0-37.9, adult: Secondary | ICD-10-CM | POA: Diagnosis not present

## 2023-07-03 DIAGNOSIS — G4719 Other hypersomnia: Secondary | ICD-10-CM

## 2023-07-03 DIAGNOSIS — Z87898 Personal history of other specified conditions: Secondary | ICD-10-CM

## 2023-07-03 DIAGNOSIS — G4733 Obstructive sleep apnea (adult) (pediatric): Secondary | ICD-10-CM | POA: Diagnosis not present

## 2023-07-03 DIAGNOSIS — E66812 Obesity, class 2: Secondary | ICD-10-CM | POA: Diagnosis not present

## 2023-07-03 NOTE — Progress Notes (Signed)
 Corey [name]    [id number]    [birth year]  Primary Care Physician:Kremer, Adolm Ahumada, MD  Referring Physician: Tonna Frederic, MD [address] [place],  [place] [address]  Chief complaint:   Patient being seen for snoring, daytime sleepiness, nonrestorative sleep  HPI:  Has been told about snoring by spouse No witnessed apneas Sleep is nonrestorative Wakes up in the morning feeling like he is not have a good nights rest  Usually goes to bed between 9 and 10 PM, falls asleep in 5 to 10 minutes 2-3 awakenings Final wake up time about 5 AM  Heavy snoring He is only gained about 5 pounds  Does not wake up feeling rested No dryness of his mouth in the mornings, no significant headaches in the morning Does have occasional night sweats  Mom does have obstructive sleep apnea  Never smoker  Difficult to control blood pressure, on 4 medications-blood pressure runs as high as 160/100  Does not have any significant gasping respirations at night   Outpatient Encounter Medications as of 07/03/2023  Medication Sig   ALPRAZolam  (XANAX ) 1 MG tablet Take 1 tablet by mouth 1 hour prior to procedure   amLODipine  (NORVASC ) 10 MG tablet Take 1 tablet by mouth once daily   amoxicillin -clavulanate (AUGMENTIN ) 875-125 MG tablet Take 1 tablet by mouth 2 (two) times daily.   carvedilol  (COREG ) 25 MG tablet TAKE 1 TABLET BY MOUTH TWICE DAILY WITH A MEAL   carvedilol  (COREG ) 25 MG tablet Take 1 tablet (25 mg total) by mouth 2 (two) times daily with a meal.   docusate sodium  (COLACE) 100 MG capsule Take 1 capsule (100 mg total) by mouth 2 (two) times daily.   fluticasone  (FLONASE ) 50 MCG/ACT nasal spray Place 2 sprays into both nostrils daily.   hydrochlorothiazide  (HYDRODIURIL ) 25 MG tablet Take 1 tablet (25 mg total) by mouth daily.   HYDROcodone -acetaminophen  (NORCO/VICODIN) 5-325 MG tablet Take 1 tablet by mouth every 6 (six) hours as needed for moderate pain.    hydrocortisone  (ANUSOL -HC) 25 MG suppository Place 1 suppository (25 mg total) rectally 2 (two) times daily.   irbesartan  (AVAPRO ) 300 MG tablet Take 1 tablet by mouth once daily   promethazine  (PHENERGAN ) 25 MG tablet Take 1 tablet (25 mg total) by mouth every 8 (eight) hours as needed for nausea or vomiting.   promethazine -dextromethorphan (PROMETHAZINE -DM) 6.25-15 MG/5ML syrup Take 5 mLs by mouth 4 (four) times daily as needed for cough. Drowsy precautions!   rizatriptan  (MAXALT ) 10 MG tablet Take 1 tablet (10 mg total) by mouth as needed for migraine. May repeat in 2 hours if needed   No facility-administered encounter medications on file as of 07/03/2023.    Allergies as of 07/03/2023 - Review Complete 07/03/2023  Allergen Reaction Noted   Ace inhibitors Other (See Comments) and Cough 01/23/2015   Morphine and codeine Hives and Itching 05/09/2013   Hydrochlorothiazide  Other (See Comments) 01/23/2015    Past Medical History:  Diagnosis Date   Actinic keratosis due to exposure to sunlight 08/13/2017   Acute bilateral low back pain with sciatica 02/21/2016   Acute non-recurrent maxillary sinusitis 09/11/2017   Acute prostatitis 04/24/2019   Arthritis 05/07/2022   Barrett's esophagus 01/23/2015   Benign prostatic hyperplasia with weak urinary stream 04/24/2019   C6 radiculopathy 12/22/2020   Cauda equina injury without bone injury (HCC) 11/20/2018   Cellulitis 05/19/2013   Closed displaced fracture of distal phalanx of left great toe  12/28/2019   Congenital pes planus 01/23/2015   COVID-19 06/09/2019   Dysuria 07/19/2016   Elevated LDL cholesterol level 11/23/2020   Erectile dysfunction 04/24/2019   Esophageal reflux 01/23/2015   Excessive daytime sleepiness 02/22/2015   Exposure to the flu 02/12/2023   Facial dermatitis 05/07/2022   Flank pain 02/21/2016   Generalized abdominal pain 02/27/2022   Healthcare maintenance 09/11/2017   Heme positive stool 04/24/2019    Hemorrhoids 10/04/2022   History of sinusitis 04/08/2023   Hypertension    Hypertension, uncontrolled 04/08/2017   Impaired fasting glucose 01/23/2015   Kidney stones    Medication side effect 08/13/2017   Migraine without status migrainosus, not intractable 04/08/2023   Mixed hyperlipidemia 01/23/2015   Morbid obesity (HCC) 05/09/2013   New daily persistent headache 10/09/2017   Obesity (BMI 30-39.9) 05/07/2022   Obstructive sleep apnea syndrome 05/24/2015   Pleurodynia 02/12/2023   Prediabetes 10/04/2022   Primary snoring 02/22/2015   Rebound headache 04/08/2023   Right lower quadrant abdominal pain 02/21/2016   Sinusitis 01/06/2016   Stressful life event affecting family 02/27/2022   Subacromial bursitis of right shoulder joint 12/22/2020   Tachycardia 02/12/2023   Telangiectasia 08/13/2017   Urinary frequency 02/27/2022   Vasectomy status 05/07/2022    Past Surgical History:  Procedure Laterality Date   COLONOSCOPY  05/22/2019   ESOPHAGOGASTRODUODENOSCOPY     over 25 years ago. Had a stomach ulcer and possibly dx of barretts esophagus   KNEE SURGERY Right    x2   LITHOTRIPSY     x2-3    UPPER GASTROINTESTINAL ENDOSCOPY  05/22/2019    Family History  Problem Relation Age of Onset   Arthritis Mother    Hypertension Mother    Hypertension Father    Kidney cancer Father        died 38    Asthma Sister    Asthma Sister    Colon cancer Paternal Aunt        died 49   Esophageal cancer Neg Hx    Colon polyps Neg Hx    Rectal cancer Neg Hx    Stomach cancer Neg Hx     Social History   Socioeconomic History   Marital status: Married    Spouse name: Not on file   Number of children: Not on file   Years of education: Not on file   Highest education level: Not on file  Occupational History   Not on file  Tobacco Use   Smoking status: Never   Smokeless tobacco: Never  Vaping Use   Vaping status: Never Used  Substance and Sexual Activity   Alcohol use: No    Drug use: No   Sexual activity: Yes  Other Topics Concern   Not on file  Social History Narrative   Not on file   Social Drivers of Health   Financial Resource Strain: Not on file  Food Insecurity: Not on file  Transportation Needs: Not on file  Physical Activity: Not on file  Stress: Not on file  Social Connections: Not on file  Intimate Partner Violence: Not on file    Review of Systems  Respiratory:  Negative for shortness of breath.   Psychiatric/Behavioral:  Positive for sleep disturbance.     Vitals:   07/03/23 1248  BP: (!) 157/99  Pulse: 63  SpO2: 96%     Physical Exam Constitutional:      Appearance: He is obese.  HENT:     Head: Normocephalic.  Nose: Nose normal.     Mouth/Throat:     Mouth: Mucous membranes are moist.     Comments: Mallampati 4 with crowded oropharynx Eyes:     General: No scleral icterus. Cardiovascular:     Rate and Rhythm: Normal rate and regular rhythm.     Heart sounds: No murmur heard.    No friction rub.  Pulmonary:     Effort: No respiratory distress.     Breath sounds: No stridor. No wheezing or rhonchi.  Musculoskeletal:     Cervical back: No rigidity or tenderness.  Neurological:     Mental Status: He is alert.  Psychiatric:        Mood and Affect: Mood normal.        No data to display        Epworth Sleepiness Scale of 18  Data Reviewed: No previous sleep study on record  Assessment:  Moderate to significant probability of significant obstructive sleep apnea  Pathophysiology of sleep disordered breathing reviewed with the patient Treatment options discussed with the patient  Difficult to control blood pressure  Class II obesity  Excessive daytime sleepiness likely related to untreated sleep disordered breathing  Plan/Recommendations: Will schedule the patient for home sleep test  Treatment options discussed with patient  Risk with not treating sleep disordered breathing discussed with  the patient   Myer Artis MD [facility] 07/03/2023, 12:59 PM  CC: Tonna Frederic,*

## 2023-07-03 NOTE — Patient Instructions (Signed)
 Schedule for home sleep testing  Continue weight loss efforts  Regular exercises  Call us  with significant concerns  Tentative follow-up in 3 months

## 2023-07-13 ENCOUNTER — Encounter

## 2023-07-13 DIAGNOSIS — Z87898 Personal history of other specified conditions: Secondary | ICD-10-CM

## 2023-07-13 DIAGNOSIS — G4719 Other hypersomnia: Secondary | ICD-10-CM

## 2023-07-15 ENCOUNTER — Ambulatory Visit (HOSPITAL_BASED_OUTPATIENT_CLINIC_OR_DEPARTMENT_OTHER)

## 2023-07-18 ENCOUNTER — Other Ambulatory Visit: Payer: Self-pay | Admitting: Family Medicine

## 2023-07-18 DIAGNOSIS — I10 Essential (primary) hypertension: Secondary | ICD-10-CM

## 2023-07-22 ENCOUNTER — Encounter: Payer: Self-pay | Admitting: Pulmonary Disease

## 2023-07-24 ENCOUNTER — Telehealth: Payer: Self-pay

## 2023-07-24 NOTE — Telephone Encounter (Signed)
   Pre-operative Risk Assessment    Patient Name: Corey [name]  DOB: 11-09-1975 MRN: [id number]   Date of last office visit: 06/13/23  Date of next office visit: N/A   Request for Surgical Clearance    Procedure:  right shoulder arthroscopic rotator cuff repair, subacromial decompression  Date of Surgery:  Clearance 08/13/23                                Surgeon:  Dr. Sammye Cristal Surgeon's Group or Practice Name:  [facility] Phone number:  [phone] Fax number:  [phone]   Type of Clearance Requested:   - Medical    Type of Anesthesia:  choice   Additional requests/questions:    SignedBascom Lily   07/24/2023, 4:27 PM

## 2023-07-25 NOTE — Telephone Encounter (Signed)
   Name: Corey [name]  DOB: 1975/09/12  MRN: [id number]  Primary Cardiologist: None   Preoperative team, please contact this patient and set up a phone call appointment for further preoperative risk assessment. Please obtain consent and complete medication review. Thank you for your help.  I confirm that guidance regarding antiplatelet and oral anticoagulation therapy has been completed and, if necessary, noted below.  None requested.  I also confirmed the patient resides in the state of [place] . As per [facility] Medical Board telemedicine laws, the patient must reside in the state in which the provider is licensed.   Carie Charity, NP 07/25/2023, 12:34 PM [facility]

## 2023-07-25 NOTE — Telephone Encounter (Signed)
 Tried calling patient to schedule tele preop appt no answer wasn't able to leave vm will try again at a later time

## 2023-07-26 ENCOUNTER — Telehealth: Payer: Self-pay

## 2023-07-26 ENCOUNTER — Telehealth: Payer: Self-pay | Admitting: Pulmonary Disease

## 2023-07-26 ENCOUNTER — Other Ambulatory Visit: Payer: Self-pay

## 2023-07-26 DIAGNOSIS — G4733 Obstructive sleep apnea (adult) (pediatric): Secondary | ICD-10-CM

## 2023-07-26 NOTE — Telephone Encounter (Signed)
 Tried to cal patietn to get him scheduled for a pre-op clearance.  No answer/no VM set up.    LOV 04/1723 FOV  none scheduled.  Dm/cma

## 2023-07-26 NOTE — Progress Notes (Signed)
 Pt's wife, Odilia Bennett, returned the call. (DPR) I informed Odilia Bennett of Dr Alyne Jules note and placed order for CPAP. Odilia Bennett verbalized understanding. I will route a message to the front desk regarding scheduling after 31-30 days of CPAP usage. NFN

## 2023-07-26 NOTE — Telephone Encounter (Signed)
 Pt's wife, Odilia Bennett( dpr) returned the phone call. I informed pt's wife of Dr Alyne Jules message and placed order for CPAP. Odilia Bennett verbalized understanding. NFN

## 2023-07-26 NOTE — Telephone Encounter (Signed)
 Call patient  Sleep study result  Date of study: 07/13/2023  Impression: Moderate obstructive sleep apnea with moderate oxygen desaturations.  AHI of 28.6, O2 nadir of 82%  Recommendation: DME referral  Recommend CPAP therapy for moderate obstructive sleep apnea  Auto titrating CPAP with pressure settings of 5-15 will be appropriate, with heated humidification with patient's mask of choice.  Encourage weight loss measures  Follow-up in the office 4 to 6 weeks following initiation of treatment

## 2023-07-26 NOTE — Telephone Encounter (Signed)
 VM not set up, could not leave message to call back to schedule tele preop appt. I will update the requesting office the pt needs to call our office for a tele visit for preop clearance.

## 2023-07-26 NOTE — Telephone Encounter (Signed)
 ATC x1 unable to leave a voicemail on patient's phone will post result's on mychart.

## 2023-07-26 NOTE — Telephone Encounter (Signed)
 Copied from CRM [id number]. Topic: Clinical - Lab/Test Results >> Jul 26, 2023  9:20 AM Isabell A wrote: Reason for CRM: Spouse returning phone call from [place] to discuss sleep study results.   Callback number: [phone]   ATC X1. Unable to lvm due to mailbox not open. Encounter already made for this. Completing this CRM as we do not need a duplicate.

## 2023-07-29 NOTE — Telephone Encounter (Signed)
 3rd and final attempt to reach pt regarding surgical clearance and the need for a tele visit.  Voicemail is not set up and we cannot leave a message.  Will send back to the requesting surgeon's office to make them aware and they can have pt call the office.

## 2023-08-01 ENCOUNTER — Telehealth: Payer: Self-pay | Admitting: *Deleted

## 2023-08-01 NOTE — Telephone Encounter (Signed)
 I s/w the pt's wife who scheduled tele preop appt for the pt 08/08/23. Med rec and consent are done.      Patient Consent for Virtual Visit        [name] has provided verbal consent on 08/01/2023 for a virtual visit (video or telephone).   CONSENT FOR VIRTUAL VISIT FOR:  Corey [name]  By participating in this virtual visit I agree to the following:  I hereby voluntarily request, consent and authorize [facility] and its employed or contracted physicians, physician assistants, nurse practitioners or other licensed health care professionals (the Practitioner), to provide me with telemedicine health care services (the "Services) as deemed necessary by the treating Practitioner. I acknowledge and consent to receive the Services by the Practitioner via telemedicine. I understand that the telemedicine visit will involve communicating with the Practitioner through live audiovisual communication technology and the disclosure of certain medical information by electronic transmission. I acknowledge that I have been given the opportunity to request an in-person assessment or other available alternative prior to the telemedicine visit and am voluntarily participating in the telemedicine visit.  I understand that I have the right to withhold or withdraw my consent to the use of telemedicine in the course of my care at any time, without affecting my right to future care or treatment, and that the Practitioner or I may terminate the telemedicine visit at any time. I understand that I have the right to inspect all information obtained and/or recorded in the course of the telemedicine visit and may receive copies of available information for a reasonable fee.  I understand that some of the potential risks of receiving the Services via telemedicine include:  Delay or interruption in medical evaluation due to technological equipment failure or disruption; Information transmitted may not be sufficient  (e.g. poor resolution of images) to allow for appropriate medical decision making by the Practitioner; and/or  In rare instances, security protocols could fail, causing a breach of personal health information.  Furthermore, I acknowledge that it is my responsibility to provide information about my medical history, conditions and care that is complete and accurate to the best of my ability. I acknowledge that Practitioner's advice, recommendations, and/or decision may be based on factors not within their control, such as incomplete or inaccurate data provided by me or distortions of diagnostic images or specimens that may result from electronic transmissions. I understand that the practice of medicine is not an exact science and that Practitioner makes no warranties or guarantees regarding treatment outcomes. I acknowledge that a copy of this consent can be made available to me via my patient portal [facility] MyChart), or I can request a printed copy by calling the office of  [facility].    I understand that my insurance will be billed for this visit.   I have read or had this consent read to me. I understand the contents of this consent, which adequately explains the benefits and risks of the Services being provided via telemedicine.  I have been provided ample opportunity to ask questions regarding this consent and the Services and have had my questions answered to my satisfaction. I give my informed consent for the services to be provided through the use of telemedicine in my medical care

## 2023-08-01 NOTE — Telephone Encounter (Signed)
 Pt's wife states surgery date has been moved to 08/27/23. I have updated the clearance notes.

## 2023-08-01 NOTE — Telephone Encounter (Signed)
 SABRA

## 2023-08-01 NOTE — Telephone Encounter (Signed)
 I s/w the pt's wife who scheduled tele preop appt for the pt 08/08/23. Med rec and consent are done.

## 2023-08-08 ENCOUNTER — Ambulatory Visit (HOSPITAL_BASED_OUTPATIENT_CLINIC_OR_DEPARTMENT_OTHER)

## 2023-08-08 ENCOUNTER — Ambulatory Visit: Attending: Cardiology | Admitting: Emergency Medicine

## 2023-08-08 DIAGNOSIS — Z01810 Encounter for preprocedural cardiovascular examination: Secondary | ICD-10-CM | POA: Diagnosis not present

## 2023-08-08 NOTE — Progress Notes (Signed)
 Virtual Visit via Telephone Note   Because of Corey [name] co-morbid illnesses, he is at least at moderate risk for complications without adequate follow up.  This format is felt to be most appropriate for this patient at this time.  Due to technical limitations with video connection (technology), today's appointment will be conducted as an audio only telehealth visit, and Corey [name] verbally agreed to proceed in this manner.   All issues noted in this document were discussed and addressed.  No physical exam could be performed with this format.  Evaluation Performed:  Preoperative cardiovascular risk assessment _____________   Date:  08/08/2023   Patient ID:  Corey [name], DOB 1975/12/28, MRN [id number] Patient Location:  Home Provider location:   Office  Primary Care Provider:  Berneta Elsie Sayre, MD Primary Cardiologist:  None  Chief Complaint / Patient Profile   48 y.o. y/o male with a h/o hypertension, obesity, hyperlipidemia, prediabetes, migraine, allergic rhinitis, sleep apnea who is pending right shoulder arthroscopic rotator cuff repair, subacromial decompression on 08/13/2023 with [facility] and presents today for telephonic preoperative cardiovascular risk assessment.  History of Present Illness    Corey [name] is a 48 y.o. male who presents via audio/video conferencing for a telehealth visit today.  Pt was last seen in cardiology clinic on 06/13/2023 by Dr. Liborio.  At that time Corey [name] was doing well.  The patient is now pending procedure as outlined above. Since his last visit, he denies chest pain, shortness of breath, lower extremity edema, fatigue, palpitations, melena, hematuria, hemoptysis, diaphoresis, weakness, presyncope, syncope, orthopnea, and PND.  Today patient is doing well overall.  He is without acute cardiovascular concerns or complaints.  He denies any anginal symptoms or exertional symptoms.  Blood pressure much better  controlled now he is on CPAP therapy.  BP at home ranges 130s-140s.  I suspect BP to continue to improve as he maintains CPAP therapy.  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  Stays active with his children.  Overall is easily able to complete greater than 4 METS.  Past Medical History    Past Medical History:  Diagnosis Date   Actinic keratosis due to exposure to sunlight 08/13/2017   Acute bilateral low back pain with sciatica 02/21/2016   Acute non-recurrent maxillary sinusitis 09/11/2017   Acute prostatitis 04/24/2019   Arthritis 05/07/2022   Barrett's esophagus 01/23/2015   Benign prostatic hyperplasia with weak urinary stream 04/24/2019   C6 radiculopathy 12/22/2020   Cauda equina injury without bone injury (HCC) 11/20/2018   Cellulitis 05/19/2013   Closed displaced fracture of distal phalanx of left great toe 12/28/2019   Congenital pes planus 01/23/2015   COVID-19 06/09/2019   Dysuria 07/19/2016   Elevated LDL cholesterol level 11/23/2020   Erectile dysfunction 04/24/2019   Esophageal reflux 01/23/2015   Excessive daytime sleepiness 02/22/2015   Exposure to the flu 02/12/2023   Facial dermatitis 05/07/2022   Flank pain 02/21/2016   Generalized abdominal pain 02/27/2022   Healthcare maintenance 09/11/2017   Heme positive stool 04/24/2019   Hemorrhoids 10/04/2022   History of sinusitis 04/08/2023   Hypertension    Hypertension, uncontrolled 04/08/2017   Impaired fasting glucose 01/23/2015   Kidney stones    Medication side effect 08/13/2017   Migraine without status migrainosus, not intractable 04/08/2023   Mixed hyperlipidemia 01/23/2015   Morbid obesity (HCC) 05/09/2013   New daily persistent headache  10/09/2017   Obesity (BMI 30-39.9) 05/07/2022   Obstructive sleep apnea syndrome 05/24/2015   Pleurodynia 02/12/2023   Prediabetes 10/04/2022   Primary snoring 02/22/2015   Rebound headache 04/08/2023   Right lower quadrant  abdominal pain 02/21/2016   Sinusitis 01/06/2016   Stressful life event affecting family 02/27/2022   Subacromial bursitis of right shoulder joint 12/22/2020   Tachycardia 02/12/2023   Telangiectasia 08/13/2017   Urinary frequency 02/27/2022   Vasectomy status 05/07/2022   Past Surgical History:  Procedure Laterality Date   COLONOSCOPY  05/22/2019   ESOPHAGOGASTRODUODENOSCOPY     over 25 years ago. Had a stomach ulcer and possibly dx of barretts esophagus   KNEE SURGERY Right    x2   LITHOTRIPSY     x2-3    UPPER GASTROINTESTINAL ENDOSCOPY  05/22/2019    Allergies  Allergies  Allergen Reactions   Ace Inhibitors Other (See Comments) and Cough    Other reaction(s): Cough (ALLERGY/intolerance)   Morphine And Codeine Hives and Itching   Hydrochlorothiazide  Other (See Comments)    Sun Sensitivity Sweating     Home Medications    Prior to Admission medications   Medication Sig Start Date End Date Taking? Authorizing Provider  ALPRAZolam  (XANAX ) 1 MG tablet Take 1 tablet by mouth 1 hour prior to procedure 08/05/22   Stoneking, Adine PARAS., MD  amLODipine  (NORVASC ) 10 MG tablet Take 1 tablet by mouth once daily 07/18/23   Berneta Elsie Sayre, MD  amoxicillin -clavulanate (AUGMENTIN ) 875-125 MG tablet Take 1 tablet by mouth 2 (two) times daily. 03/26/23   McElwee, Tinnie LABOR, NP  carvedilol  (COREG ) 25 MG tablet TAKE 1 TABLET BY MOUTH TWICE DAILY WITH A MEAL 06/05/23   Berneta Elsie Sayre, MD  carvedilol  (COREG ) 25 MG tablet Take 1 tablet (25 mg total) by mouth 2 (two) times daily with a meal. 06/05/23   Berneta Elsie Sayre, MD  docusate sodium  (COLACE) 100 MG capsule Take 1 capsule (100 mg total) by mouth 2 (two) times daily. 09/19/22   Berneta Elsie Sayre, MD  fluticasone  (FLONASE ) 50 MCG/ACT nasal spray Place 2 sprays into both nostrils daily. 04/22/23   Berneta Elsie Sayre, MD  hydrochlorothiazide  (HYDRODIURIL ) 25 MG tablet Take 1 tablet (25 mg total) by mouth daily. 06/13/23  09/11/23  Madireddy, Alean SAUNDERS, MD  HYDROcodone -acetaminophen  (NORCO/VICODIN) 5-325 MG tablet Take 1 tablet by mouth every 6 (six) hours as needed for moderate pain. 08/06/22   Stoneking, Adine PARAS., MD  hydrocortisone  (ANUSOL -HC) 25 MG suppository Place 1 suppository (25 mg total) rectally 2 (two) times daily. 09/19/22   Berneta Elsie Sayre, MD  irbesartan  (AVAPRO ) 300 MG tablet Take 1 tablet by mouth once daily 06/10/23   Berneta Elsie Sayre, MD  promethazine  (PHENERGAN ) 25 MG tablet Take 1 tablet (25 mg total) by mouth every 8 (eight) hours as needed for nausea or vomiting. 04/08/23   Berneta Elsie Sayre, MD  promethazine -dextromethorphan (PROMETHAZINE -DM) 6.25-15 MG/5ML syrup Take 5 mLs by mouth 4 (four) times daily as needed for cough. Drowsy precautions! 03/26/23   McElwee, Lauren A, NP  rizatriptan  (MAXALT ) 10 MG tablet Take 1 tablet (10 mg total) by mouth as needed for migraine. May repeat in 2 hours if needed 04/22/23   Berneta Elsie Sayre, MD    Physical Exam    Vital Signs:  AWAIS COBARRUBIAS does not have vital signs available for review today.  Given telephonic nature of communication, physical exam is limited. AAOx3. NAD. Normal affect.  Speech and respirations are unlabored.  Accessory Clinical Findings    None  Assessment & Plan    1.  Preoperative Cardiovascular Risk Assessment: According to the Revised Cardiac Risk Index (RCRI), his Perioperative Risk of Major Cardiac Event is (%): 0.4. His Functional Capacity in METs is: 9.89 according to the Duke Activity Status Index (DASI). Therefore, based on ACC/AHA guidelines, patient would be at acceptable risk for the planned procedure without further cardiovascular testing. I will route this recommendation to the requesting party via Epic fax function.  The patient was advised that if he develops new symptoms prior to surgery to contact our office to arrange for a follow-up visit, and he verbalized understanding.  A copy of this  note will be routed to requesting surgeon.  Time:   Today, I have spent 8 minutes with the patient with telehealth technology discussing medical history, symptoms, and management plan.     Lum LITTIE Louis, NP  08/08/2023, 1:53 PM

## 2023-08-20 HISTORY — PX: SHOULDER SURGERY: SHX246

## 2023-08-22 ENCOUNTER — Other Ambulatory Visit: Payer: Self-pay | Admitting: Family Medicine

## 2023-08-22 DIAGNOSIS — I10 Essential (primary) hypertension: Secondary | ICD-10-CM

## 2023-09-06 ENCOUNTER — Ambulatory Visit (INDEPENDENT_AMBULATORY_CARE_PROVIDER_SITE_OTHER): Admitting: Family Medicine

## 2023-09-06 ENCOUNTER — Encounter: Payer: Self-pay | Admitting: Family Medicine

## 2023-09-06 VITALS — BP 128/86 | HR 88 | Temp 98.1°F | Ht 71.0 in | Wt 258.8 lb

## 2023-09-06 DIAGNOSIS — L231 Allergic contact dermatitis due to adhesives: Secondary | ICD-10-CM

## 2023-09-06 DIAGNOSIS — I10 Essential (primary) hypertension: Secondary | ICD-10-CM | POA: Diagnosis not present

## 2023-09-06 DIAGNOSIS — E7800 Pure hypercholesterolemia, unspecified: Secondary | ICD-10-CM

## 2023-09-06 DIAGNOSIS — Z9889 Other specified postprocedural states: Secondary | ICD-10-CM

## 2023-09-06 DIAGNOSIS — R7303 Prediabetes: Secondary | ICD-10-CM | POA: Diagnosis not present

## 2023-09-06 LAB — BASIC METABOLIC PANEL WITH GFR
BUN: 15 mg/dL (ref 6–23)
CO2: 28 meq/L (ref 19–32)
Calcium: 8.9 mg/dL (ref 8.4–10.5)
Chloride: 104 meq/L (ref 96–112)
Creatinine, Ser: 0.99 mg/dL (ref 0.40–1.50)
GFR: 88.91 mL/min (ref >60.00)
Glucose, Bld: 103 mg/dL — ABNORMAL HIGH (ref 70–99)
Potassium: 4.3 meq/L (ref 3.5–5.1)
Sodium: 139 meq/L (ref 135–145)

## 2023-09-06 LAB — HEMOGLOBIN A1C: Hgb A1c MFr Bld: 5.9 % (ref 4.6–6.5)

## 2023-09-06 NOTE — Progress Notes (Signed)
 Established Patient Office Visit   Subjective:  Patient ID: Corey [name], male    DOB: 10-29-1975  Age: 48 y.o. MRN: [id number]  Chief Complaint  Patient presents with   Medical Management of Chronic Issues    Follow up. Pt is fasting. Pt had a fall 2 months ago. Pt had surgery 2 weeks ago for torn ligament.    Rash    Rash in right side of neck and trunk x 1 day.     Rash   Encounter Diagnoses  Name Primary?   Elevated LDL cholesterol level Yes   Prediabetes    Allergic contact dermatitis due to adhesives    Essential hypertension    History of shoulder surgery    For follow-up of above.  Status post rotator cuff repair of right shoulder 7 days ago.  Recently developed a pruritic rash around the right shoulder right side of neck and upper chest.  No new skin contacts other than the bandage and surgical tape.  No new medications other than the HCTZ that cardiology had added to his blood pressure regimen.  However after he obtained his CPAP machine and started using it his blood pressure dropped significantly.  He has not been taking HCTZ recently.  He was given oxycodone for pain after surgery and stopped it 4 days prior to onset of the rash secondary to constipation.  He has been able to lose some weight with dietary changes.   Review of Systems  Constitutional: Negative.   HENT: Negative.    Eyes:  Negative for blurred vision, discharge and redness.  Respiratory: Negative.    Cardiovascular: Negative.   Gastrointestinal:  Negative for abdominal pain.  Genitourinary: Negative.   Musculoskeletal: Negative.  Negative for myalgias.  Skin:  Positive for itching and rash.  Neurological:  Negative for tingling, loss of consciousness and weakness.  Endo/Heme/Allergies:  Negative for polydipsia.     Current Outpatient Medications:    ALPRAZolam  (XANAX ) 1 MG tablet, Take 1 tablet by mouth 1 hour prior to procedure, Disp: 1 tablet, Rfl: 0   amLODipine  (NORVASC ) 10 MG tablet, Take  1 tablet by mouth once daily, Disp: 90 tablet, Rfl: 0   carvedilol  (COREG ) 25 MG tablet, TAKE 1 TABLET BY MOUTH TWICE DAILY WITH A MEAL, Disp: 60 tablet, Rfl: 0   carvedilol  (COREG ) 25 MG tablet, Take 1 tablet (25 mg total) by mouth 2 (two) times daily with a meal., Disp: 60 tablet, Rfl: 3   docusate sodium  (COLACE) 100 MG capsule, Take 1 capsule (100 mg total) by mouth 2 (two) times daily., Disp: 50 capsule, Rfl: 0   fluticasone  (FLONASE ) 50 MCG/ACT nasal spray, Place 2 sprays into both nostrils daily., Disp: 16 g, Rfl: 3   hydrochlorothiazide  (HYDRODIURIL ) 25 MG tablet, Take 1 tablet (25 mg total) by mouth daily., Disp: 90 tablet, Rfl: 3   hydrocortisone  (ANUSOL -HC) 25 MG suppository, Place 1 suppository (25 mg total) rectally 2 (two) times daily., Disp: 12 suppository, Rfl: 0   irbesartan  (AVAPRO ) 300 MG tablet, Take 1 tablet by mouth once daily, Disp: 30 tablet, Rfl: 0   amoxicillin -clavulanate (AUGMENTIN ) 875-125 MG tablet, Take 1 tablet by mouth 2 (two) times daily. (Patient not taking: Reported on 09/06/2023), Disp: 20 tablet, Rfl: 0   HYDROcodone -acetaminophen  (NORCO/VICODIN) 5-325 MG tablet, Take 1 tablet by mouth every 6 (six) hours as needed for moderate pain., Disp: 8 tablet, Rfl: 0   promethazine  (PHENERGAN ) 25 MG tablet, Take 1 tablet (25 mg total) by  mouth every 8 (eight) hours as needed for nausea or vomiting. (Patient not taking: Reported on 09/06/2023), Disp: 20 tablet, Rfl: 0   promethazine -dextromethorphan (PROMETHAZINE -DM) 6.25-15 MG/5ML syrup, Take 5 mLs by mouth 4 (four) times daily as needed for cough. Drowsy precautions! (Patient not taking: Reported on 09/06/2023), Disp: 118 mL, Rfl: 0   rizatriptan  (MAXALT ) 10 MG tablet, Take 1 tablet (10 mg total) by mouth as needed for migraine. May repeat in 2 hours if needed, Disp: 10 tablet, Rfl: 0   Objective:     BP 128/86 (BP Location: Left Arm, Patient Position: Sitting, Cuff Size: Large)   Pulse 88   Temp 98.1 F (36.7 C) (Temporal)    Ht 5' 11 (1.803 m)   Wt 258 lb 12.8 oz (117.4 kg)   SpO2 99%   BMI 36.10 kg/m  BP Readings from Last 3 Encounters:  09/06/23 128/86  07/03/23 (!) 157/99  06/13/23 (!) 162/108   Wt Readings from Last 3 Encounters:  09/06/23 258 lb 12.8 oz (117.4 kg)  07/03/23 267 lb (121.1 kg)  06/13/23 262 lb 6.4 oz (119 kg)      Physical Exam Constitutional:      General: He is not in acute distress.    Appearance: Normal appearance. He is not ill-appearing, toxic-appearing or diaphoretic.  HENT:     Head: Normocephalic and atraumatic.     Right Ear: External ear normal.     Left Ear: External ear normal.  Eyes:     General: No scleral icterus.       Right eye: No discharge.        Left eye: No discharge.     Extraocular Movements: Extraocular movements intact.     Conjunctiva/sclera: Conjunctivae normal.  Pulmonary:     Effort: Pulmonary effort is normal. No respiratory distress.  Skin:    General: Skin is warm and dry.      Neurological:     Mental Status: He is alert and oriented to person, place, and time.  Psychiatric:        Mood and Affect: Mood normal.        Behavior: Behavior normal.      No results found for any visits on 09/06/23.    The 10-year ASCVD risk score (Arnett DK, et al., 2019) is: 5.3%    Assessment & Plan:   Elevated LDL cholesterol level  Prediabetes -     Basic metabolic panel with GFR -     Hemoglobin A1c  Allergic contact dermatitis due to adhesives  Essential hypertension -     Basic metabolic panel with GFR  History of shoulder surgery    Return in about 6 months (around 03/08/2024), or if symptoms worsen or fail to improve, for chronic disease follow-up, annual physical.  Allergic reaction likely from surgical tape for dressing.  Follow-up with surgery today.  Continue all medications otherwise.  Continue to check and record BPs.  Follow-up with cardiology as directed.  Continue CPAP usage.  Follow-up on A1c.  Continue weight loss  efforts.  May need to consider statin therapy.  Elsie Sim Lent, MD

## 2023-09-09 ENCOUNTER — Ambulatory Visit: Payer: Self-pay | Admitting: Family Medicine

## 2023-09-27 ENCOUNTER — Ambulatory Visit (HOSPITAL_BASED_OUTPATIENT_CLINIC_OR_DEPARTMENT_OTHER): Admitting: Primary Care

## 2023-09-27 ENCOUNTER — Encounter (HOSPITAL_BASED_OUTPATIENT_CLINIC_OR_DEPARTMENT_OTHER): Payer: Self-pay | Admitting: Primary Care

## 2023-09-27 VITALS — BP 136/92 | HR 78 | Ht 71.0 in | Wt 254.0 lb

## 2023-09-27 DIAGNOSIS — G4733 Obstructive sleep apnea (adult) (pediatric): Secondary | ICD-10-CM

## 2023-09-27 NOTE — Progress Notes (Signed)
 @Patient  ID: Corey [name], male    DOB: [birth year], 48 y.o.   MRN: [id number]  Chief Complaint  Patient presents with   Follow-up    OSA on CPAP    Referring provider: Berneta Elsie Sim DEWAINE  HPI: 48 year old male, never smoked. PMH significant for OSA, HTN, barrett's esophagus, sleep apnea, hyperlipidemia, obesity.   09/27/2023 Discussed the use of AI scribe software for clinical note transcription with the patient, who gave verbal consent to proceed.  History of Present Illness Corey [name] is a 48 year old male with moderate to severe obstructive sleep apnea who presents for a follow-up on CPAP therapy.  He was diagnosed with moderate to severe obstructive sleep apnea following a sleep study in June, which showed 28.5 apneic events per hour and oxygen desaturation to 82%. He started CPAP therapy and uses it 97% of the time, with an average usage of four hours and two minutes per night. The CPAP is set to auto pressure between 5 to 15 cm H2O, with an average pressure of 7 cm H2O and a maximum of 10.3 cm H2O. However, compliance over four hours is at 53%, which he attributes to recent shoulder surgery.  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 The surgery has impacted his ability to use the CPAP consistently for over four hours, as he sometimes struggles with sleep due to pain.  He reports significant improvement in his blood pressure since starting CPAP therapy, noting that it has been high for about 30 years. He was previously on four medications but has stopped taking a diuretic, and his blood pressure remains controlled, averaging 116/65 mmHg. He no longer snores and experiences more restorative sleep, feeling better upon waking.  He has an 32-month-old son. He is not at his goal weight and acknowledges that weight loss could potentially impact his sleep apnea.  Airview download 08/25/23-09/23/23 Usage days 29/30  days; 16 days (53%) > 4 hours Average usage days used 4 hours 2 mins Pressure 5-15cm h20 (9.4cm h20-95%) Airleaks 8.8L/min (95%) AHI 0.5    Allergies  Allergen Reactions   Ace Inhibitors Other (See Comments) and Cough    Other reaction(s): Cough (ALLERGY/intolerance)   Morphine And Codeine Hives and Itching   Hydrochlorothiazide  Other (See Comments)    Sun Sensitivity Sweating     Immunization History  Administered Date(s) Administered   Tdap 12/01/2010, 05/09/2013    Past Medical History:  Diagnosis Date   Actinic keratosis due to exposure to sunlight 08/13/2017   Acute bilateral low back pain with sciatica 02/21/2016   Acute non-recurrent maxillary sinusitis 09/11/2017   Acute prostatitis 04/24/2019   Arthritis 05/07/2022   Barrett's esophagus 01/23/2015   Benign prostatic hyperplasia with weak urinary stream 04/24/2019   C6 radiculopathy 12/22/2020   Cauda equina injury without bone injury (HCC) 11/20/2018   Cellulitis 05/19/2013   Closed displaced fracture of distal phalanx of left great toe 12/28/2019   Congenital pes planus 01/23/2015   COVID-19 06/09/2019   Dysuria 07/19/2016   Elevated LDL cholesterol level 11/23/2020   Erectile dysfunction 04/24/2019   Esophageal reflux 01/23/2015   Excessive daytime sleepiness 02/22/2015   Exposure to the flu 02/12/2023   Facial dermatitis 05/07/2022   Flank pain 02/21/2016   Generalized abdominal pain 02/27/2022   Healthcare maintenance 09/11/2017   Heme positive stool 04/24/2019   Hemorrhoids 10/04/2022   History of sinusitis 04/08/2023   Hypertension  Hypertension, uncontrolled 04/08/2017   Impaired fasting glucose 01/23/2015   Kidney stones    Medication side effect 08/13/2017   Migraine without status migrainosus, not intractable 04/08/2023   Mixed hyperlipidemia 01/23/2015   Morbid obesity (HCC) 05/09/2013   New daily persistent headache 10/09/2017   Obesity (BMI 30-39.9) 05/07/2022   Obstructive sleep  apnea syndrome 05/24/2015   Pleurodynia 02/12/2023   Prediabetes 10/04/2022   Primary snoring 02/22/2015   Rebound headache 04/08/2023   Right lower quadrant abdominal pain 02/21/2016   Sinusitis 01/06/2016   Stressful life event affecting family 02/27/2022   Subacromial bursitis of right shoulder joint 12/22/2020   Tachycardia 02/12/2023   Telangiectasia 08/13/2017   Urinary frequency 02/27/2022   Vasectomy status 05/07/2022    Tobacco History: Social History   Tobacco Use  Smoking Status Never  Smokeless Tobacco Never   Counseling given: Not Answered   Outpatient Medications Prior to Visit  Medication Sig Dispense Refill   ALPRAZolam  (XANAX ) 1 MG tablet Take 1 tablet by mouth 1 hour prior to procedure 1 tablet 0   amLODipine  (NORVASC ) 10 MG tablet Take 1 tablet by mouth once daily 90 tablet 0   amoxicillin -clavulanate (AUGMENTIN ) 875-125 MG tablet Take 1 tablet by mouth 2 (two) times daily. 20 tablet 0   carvedilol  (COREG ) 25 MG tablet TAKE 1 TABLET BY MOUTH TWICE DAILY WITH A MEAL 60 tablet 0   carvedilol  (COREG ) 25 MG tablet Take 1 tablet (25 mg total) by mouth 2 (two) times daily with a meal. 60 tablet 3   docusate sodium  (COLACE) 100 MG capsule Take 1 capsule (100 mg total) by mouth 2 (two) times daily. 50 capsule 0   fluticasone  (FLONASE ) 50 MCG/ACT nasal spray Place 2 sprays into both nostrils daily. 16 g 3   hydrochlorothiazide  (HYDRODIURIL ) 25 MG tablet Take 1 tablet (25 mg total) by mouth daily. 90 tablet 3   HYDROcodone -acetaminophen  (NORCO/VICODIN) 5-325 MG tablet Take 1 tablet by mouth every 6 (six) hours as needed for moderate pain. 8 tablet 0   hydrocortisone  (ANUSOL -HC) 25 MG suppository Place 1 suppository (25 mg total) rectally 2 (two) times daily. 12 suppository 0   irbesartan  (AVAPRO ) 300 MG tablet Take 1 tablet by mouth once daily 30 tablet 0   promethazine  (PHENERGAN ) 25 MG tablet Take 1 tablet (25 mg total) by mouth every 8 (eight) hours as needed for  nausea or vomiting. 20 tablet 0   promethazine -dextromethorphan (PROMETHAZINE -DM) 6.25-15 MG/5ML syrup Take 5 mLs by mouth 4 (four) times daily as needed for cough. Drowsy precautions! 118 mL 0   rizatriptan  (MAXALT ) 10 MG tablet Take 1 tablet (10 mg total) by mouth as needed for migraine. May repeat in 2 hours if needed 10 tablet 0   No facility-administered medications prior to visit.    Review of Systems  Review of Systems  Constitutional: Negative.  Negative for fatigue.  Respiratory: Negative.    Psychiatric/Behavioral:  Negative for sleep disturbance.    Physical Exam  BP (!) 136/92   Pulse 78   Ht 5' 11 (1.803 m)   Wt 254 lb (115.2 kg)   SpO2 98%   BMI 35.43 kg/m  Physical Exam Constitutional:      Appearance: Normal appearance.  HENT:     Head: Normocephalic and atraumatic.  Cardiovascular:     Rate and Rhythm: Normal rate and regular rhythm.  Pulmonary:     Effort: Pulmonary effort is normal.     Breath sounds: Normal breath sounds.  Musculoskeletal:  General: Normal range of motion.  Skin:    General: Skin is warm and dry.  Neurological:     General: No focal deficit present.     Mental Status: He is alert and oriented to person, place, and time. Mental status is at baseline.  Psychiatric:        Mood and Affect: Mood normal.        Behavior: Behavior normal.        Thought Content: Thought content normal.        Judgment: Judgment normal.      Lab Results:  CBC    Component Value Date/Time   WBC 10.1 02/12/2023 1008   RBC 5.71 02/12/2023 1008   HGB 16.8 02/12/2023 1008   HCT 50.2 02/12/2023 1008   PLT 276.0 02/12/2023 1008   MCV 88.0 02/12/2023 1008   MCHC 33.5 02/12/2023 1008   RDW 13.6 02/12/2023 1008   LYMPHSABS 3.4 02/27/2022 1153   MONOABS 0.9 02/27/2022 1153   EOSABS 0.2 02/27/2022 1153   BASOSABS 0.1 02/27/2022 1153    BMET    Component Value Date/Time   NA 139 09/06/2023 1022   K 4.3 09/06/2023 1022   CL 104 09/06/2023  1022   CO2 28 09/06/2023 1022   GLUCOSE 103 (H) 09/06/2023 1022   BUN 15 09/06/2023 1022   CREATININE 0.99 09/06/2023 1022   CALCIUM 8.9 09/06/2023 1022    BNP No results found for: BNP  ProBNP No results found for: PROBNP  Imaging: No results found.   Assessment & Plan:    1. OSA (obstructive sleep apnea) (Primary)  Assessment and Plan Assessment & Plan Obstructive sleep apnea Moderate to severe obstructive sleep apnea diagnosed via sleep study in June, with 28.5 apneic events per hour and oxygen desaturation to 82%. CPAP therapy initiated with good compliance, though usage >4 hours is below the 70% due to recent shoulder surgery. CPAP usage is 97% of nights with an average of 4 hours and 2 minutes per night. Apnea score is well controlled with less than one event per hour. Reports significant improvement in blood pressure and sleep quality, with cessation of snoring and more restorative sleep. Risks of untreated sleep apnea include cardiac arrhythmias, stroke, pulmonary hypertension, and diabetes. Weight loss could potentially reduce sleep apnea severity and reassess need for CPAP. - Continue CPAP therapy nightly. - Encourage increasing CPAP usage to over 4 hours per night to meet insurance compliance. - Schedule a follow-up in 6 months, or sooner if needed - Educate on regular CPAP supply changes: cushion monthly, frame and tubing every 3 months, headgear and water chamber every 6 months. - Use distilled water in the CPAP water chamber. - Consider weight loss to potentially reduce sleep apnea severity and reassess need for CPAP.   Almarie LELON Ferrari, NP 09/27/2023

## 2023-09-27 NOTE — Patient Instructions (Signed)
  VISIT SUMMARY: You came in today for a follow-up on your CPAP therapy for obstructive sleep apnea. You have been using the CPAP machine regularly, although your usage has been slightly below the recommended threshold due to recent shoulder surgery. You reported significant improvements in your blood pressure and sleep quality since starting the therapy.  YOUR PLAN: -OBSTRUCTIVE SLEEP APNEA: Obstructive sleep apnea is a condition where your airway becomes blocked during sleep, causing breathing pauses. You were diagnosed with moderate to severe obstructive sleep apnea in June. You have been using CPAP therapy, which helps keep your airway open. Your compliance is good, but you need to increase your usage to over 4 hours per night to meet insurance requirements. Continue using the CPAP machine nightly, and remember to change the supplies regularly: cushion monthly, frame and tubing every 3 months, headgear and water chamber every 6 months. Use distilled water in the water chamber. Weight loss could also help reduce the severity of your sleep apnea.  INSTRUCTIONS: Please continue using your CPAP machine every night and try to increase your usage to over 4 hours per night. Schedule a follow-up appointment in 6 months, or sooner if you are contacted by the medical supply store regarding compliance. Document your shoulder surgery as a reason for your current compliance level.  Follow-up 6 months with Landry NP/ virtual visit ok

## 2023-09-29 ENCOUNTER — Other Ambulatory Visit: Payer: Self-pay | Admitting: Family Medicine

## 2023-09-29 DIAGNOSIS — I10 Essential (primary) hypertension: Secondary | ICD-10-CM

## 2023-10-18 ENCOUNTER — Other Ambulatory Visit: Payer: Self-pay | Admitting: Family Medicine

## 2023-10-18 DIAGNOSIS — I10 Essential (primary) hypertension: Secondary | ICD-10-CM

## 2023-10-25 ENCOUNTER — Other Ambulatory Visit: Payer: Self-pay | Admitting: Family Medicine

## 2023-10-25 DIAGNOSIS — I10 Essential (primary) hypertension: Secondary | ICD-10-CM

## 2023-10-25 DIAGNOSIS — R000 Tachycardia, unspecified: Secondary | ICD-10-CM

## 2023-10-28 ENCOUNTER — Ambulatory Visit (HOSPITAL_BASED_OUTPATIENT_CLINIC_OR_DEPARTMENT_OTHER)

## 2023-11-22 ENCOUNTER — Other Ambulatory Visit: Payer: Self-pay | Admitting: Family Medicine

## 2023-11-22 DIAGNOSIS — I10 Essential (primary) hypertension: Secondary | ICD-10-CM

## 2023-11-22 DIAGNOSIS — R000 Tachycardia, unspecified: Secondary | ICD-10-CM

## 2023-12-19 ENCOUNTER — Other Ambulatory Visit: Payer: Self-pay | Admitting: Family Medicine

## 2023-12-19 DIAGNOSIS — R000 Tachycardia, unspecified: Secondary | ICD-10-CM

## 2023-12-19 DIAGNOSIS — I10 Essential (primary) hypertension: Secondary | ICD-10-CM

## 2024-01-18 ENCOUNTER — Other Ambulatory Visit: Payer: Self-pay | Admitting: Family Medicine

## 2024-01-18 DIAGNOSIS — I10 Essential (primary) hypertension: Secondary | ICD-10-CM

## 2024-01-18 DIAGNOSIS — R000 Tachycardia, unspecified: Secondary | ICD-10-CM

## 2024-05-11 ENCOUNTER — Encounter: Admitting: Family Medicine
